# Patient Record
Sex: Female | Born: 2000 | Hispanic: Yes | Marital: Single | Smoking: Current every day smoker
Health system: Southern US, Community
[De-identification: ages and names within clinical notes are randomized; demographics above are authoritative.]

## PROBLEM LIST (undated history)

## (undated) DIAGNOSIS — Z464 Encounter for fitting and adjustment of orthodontic device: Secondary | ICD-10-CM

## (undated) DIAGNOSIS — IMO0001 Reserved for inherently not codable concepts without codable children: Secondary | ICD-10-CM

## (undated) DIAGNOSIS — L0591 Pilonidal cyst without abscess: Secondary | ICD-10-CM

---
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
## 2019-01-12 ENCOUNTER — Emergency Department (HOSPITAL_COMMUNITY): Payer: Medicaid Other

## 2019-01-12 ENCOUNTER — Other Ambulatory Visit: Payer: Self-pay

## 2019-01-12 ENCOUNTER — Emergency Department (HOSPITAL_COMMUNITY)
Admission: EM | Admit: 2019-01-12 | Discharge: 2019-01-12 | Disposition: A | Discharge status: Home / Self Care | Payer: Medicaid Other | Attending: Pediatric Emergency Medicine | Admitting: Pediatric Emergency Medicine

## 2019-01-12 DIAGNOSIS — R569 Unspecified convulsions: Secondary | ICD-10-CM

## 2019-01-12 DIAGNOSIS — E162 Hypoglycemia, unspecified: Secondary | ICD-10-CM | POA: Diagnosis not present

## 2019-01-12 DIAGNOSIS — F17290 Nicotine dependence, other tobacco product, uncomplicated: Secondary | ICD-10-CM | POA: Diagnosis not present

## 2019-01-12 DIAGNOSIS — Z20828 Contact with and (suspected) exposure to other viral communicable diseases: Secondary | ICD-10-CM | POA: Insufficient documentation

## 2019-01-12 LAB — I-STAT BETA HCG BLOOD, ED (MC, WL, AP ONLY): I-stat hCG, quantitative: <5 m[IU]/mL (ref <5)

## 2019-01-12 LAB — BASIC METABOLIC PANEL
Anion gap: 10 (ref 5–15)
BUN: 6 mg/dL (ref 6–20)
CO2: 22 mmol/L (ref 22–32)
Calcium: 9.5 mg/dL (ref 8.9–10.3)
Chloride: 104 mmol/L (ref 98–111)
Creatinine, Ser: 0.61 mg/dL (ref 0.44–1.00)
GFR calc Af Amer: >60 mL/min (ref >60)
GFR calc non Af Amer: >60 mL/min (ref >60)
Glucose, Bld: 63 mg/dL — ABNORMAL LOW (ref 70–99)
Potassium: 4.7 mmol/L (ref 3.5–5.1)
Sodium: 136 mmol/L (ref 135–145)

## 2019-01-12 LAB — CBC
HCT: 38.7 % (ref 36.0–46.0)
Hemoglobin: 11.8 g/dL — ABNORMAL LOW (ref 12.0–15.0)
MCH: 26.5 pg (ref 26.0–34.0)
MCHC: 30.5 g/dL (ref 30.0–36.0)
MCV: 87 fL (ref 80.0–100.0)
Platelets: 167 10*3/uL (ref 150–400)
RBC: 4.45 MIL/uL (ref 3.87–5.11)
RDW: 14.1 % (ref 11.5–15.5)
WBC: 7.8 10*3/uL (ref 4.0–10.5)
nRBC: 0 % (ref 0.0–0.2)

## 2019-01-12 LAB — CBG MONITORING, ED: Glucose-Capillary: 68 mg/dL — ABNORMAL LOW (ref 70–99)

## 2019-01-12 LAB — SARS CORONAVIRUS 2 (TAT 6-24 HRS): SARS Coronavirus 2: NEGATIVE

## 2019-01-12 MED ORDER — ACETAMINOPHEN 325 MG PO TABS
650.0000 mg | ORAL_TABLET | Freq: Once | ORAL | Status: AC
  Administered 2019-01-12: 15:00:00 650 mg via ORAL
  Filled 2019-01-12: qty 2

## 2019-01-12 NOTE — ED Notes (Signed)
Xray here for portable chest

## 2019-01-12 NOTE — ED Triage Notes (Signed)
Pt here for evaluation of seizure like activity yesterday with no hx. Pt sts she was feeling totally normal yesterday when she passed out, fell backwards hitting her head, and her sister witnessed her eyes roll back in her head and have full body shaking x 1 min with urinary incontinence. Pt awoke on her own and tried to get up but then fell backwards again. Pt a/o x4, ambulatory. C/o headache today.

## 2019-01-12 NOTE — ED Notes (Signed)
Pt. Given some graham crackers and peanut butter. Pt. Is resting in her bed.

## 2019-01-12 NOTE — ED Notes (Signed)
Pt. Given some orange juice.  

## 2019-01-12 NOTE — ED Notes (Signed)
Waiting on portable chest and CT

## 2019-01-12 NOTE — ED Notes (Signed)
Pt placed on cardiac monitor 

## 2019-01-12 NOTE — Discharge Instructions (Addendum)
No driving or operating machinery until you are cleared by a neurologist. Make sure you tell family members around you that you have had seizure-like activity in case you have another seizure. Eat regular meals to ensure no low sugar levels.  Stay well hydrated with water.

## 2019-01-12 NOTE — ED Notes (Signed)
Pt ambulatory to the restroom.  

## 2019-01-12 NOTE — ED Provider Notes (Addendum)
[facility] EMERGENCY [facility] Provider Note   CSN: [id number] Arrival date & time: 01/12/19  1245     History   Chief Complaint Chief Complaint  Patient presents with  . Seizures    HPI Nicole Gill is a 18 y.o. female.     Patient presents for assessment since seizure-like activity yesterday evening.  Patient was feeling lightheaded that evening and ended up having an episode of generalized shaking and passing out.  Initially they hope to just from passing out however she had another episode 25 minutes of generalized shaking and she urinated on herself.  No history of similar.  Patient denies any drug use.  No recent head injuries.  Patient has had headaches past 2 weeks that wake her from sleep migraine-like.  Currently mild frontal headache.  No cardiac or neurologic history.  No concerning family history.     Past Medical History:  Diagnosis Date  . Orthodontics    wears braces  . Pilonidal cyst     Patient Active Problem List   Diagnosis Date Noted  . Infected pilonidal cyst 06/25/2014    Past Surgical History:  Procedure Laterality Date  . PILONIDAL CYST EXCISION N/A 06/25/2014   Procedure: EXCISION PILONIDAL CYST PEDIATRIC;  Surgeon: Leonia CoronaShuaib Farooqui, MD;  Location: [facility];  Service: Pediatrics;  Laterality: N/A;     OB History   No obstetric history on file.      Home Medications    Prior to Admission medications   Medication Sig Start Date End Date Taking? Authorizing Provider  ibuprofen (ADVIL) 200 MG tablet Take 200 mg by mouth every 6 (six) hours as needed for mild pain.   Yes [provider]  benzonatate (TESSALON) 100 MG capsule Take 1 capsule (100 mg total) by mouth every 8 (eight) hours. Patient not taking: Reported on 01/12/2019 09/04/18   Fayrene Helperran, [place], PA-C  ondansetron (ZOFRAN) 4 MG tablet Take 1 tablet (4 mg total) by mouth every 8 (eight) hours as needed for nausea or vomiting. Patient not  taking: Reported on 01/12/2019 09/04/18   Fayrene Helperran, [place], PA-C    Family History No family history on file.  Social History Social History   Tobacco Use  . Smoking status: Current Every Day Smoker    Types: E-cigarettes  . Smokeless tobacco: Never Used  Substance Use Topics  . Alcohol use: No  . Drug use: No     Allergies   Patient has no known allergies.   Review of Systems Review of Systems  Constitutional: Negative for chills and fever.  HENT: Negative for congestion.   Eyes: Negative for visual disturbance.  Respiratory: Negative for shortness of breath.   Cardiovascular: Negative for chest pain.  Gastrointestinal: Negative for abdominal pain and vomiting.  Genitourinary: Negative for dysuria and flank pain.  Musculoskeletal: Negative for back pain, neck pain and neck stiffness.  Skin: Negative for rash.  Neurological: Positive for seizures, syncope, light-headedness and headaches.     Physical Exam Updated Vital Signs BP 126/77 (BP Location: Right Arm)   Pulse 98   Temp 97.8 F (36.6 C) (Temporal)   Resp 16   LMP 01/11/2019 (Exact Date)   SpO2 100%   Physical Exam Vitals signs and nursing note reviewed.  Constitutional:      Appearance: She is well-developed.  HENT:     Head: Normocephalic and atraumatic.  Eyes:     General:        Right eye: No discharge.  Left eye: No discharge.     Conjunctiva/sclera: Conjunctivae normal.  Neck:     Musculoskeletal: Normal range of motion and neck supple.     Trachea: No tracheal deviation.  Cardiovascular:     Rate and Rhythm: Normal rate and regular rhythm.  Pulmonary:     Effort: Pulmonary effort is normal.     Breath sounds: Normal breath sounds.  Abdominal:     General: There is no distension.     Palpations: Abdomen is soft.     Tenderness: There is no abdominal tenderness. There is no guarding.  Musculoskeletal: Normal range of motion.        General: No swelling or tenderness.  Skin:     General: Skin is warm.     Findings: No rash.  Neurological:     General: No focal deficit present.     Mental Status: She is alert and oriented to person, place, and time.     Cranial Nerves: No cranial nerve deficit.     Sensory: Sensation is intact.     Motor: Motor function is intact.     Coordination: Coordination is intact.      ED Treatments / Results  Labs (all labs ordered are listed, but only abnormal results are displayed) Labs Reviewed  BASIC METABOLIC PANEL - Abnormal; Notable for the following components:      Result Value   Glucose, Bld 63 (*)    All other components within normal limits  CBC - Abnormal; Notable for the following components:   Hemoglobin 11.8 (*)    All other components within normal limits  CBG MONITORING, ED - Abnormal; Notable for the following components:   Glucose-Capillary 68 (*)    All other components within normal limits  SARS CORONAVIRUS 2 (TAT 6-24 HRS)  I-STAT BETA HCG BLOOD, ED ([facility], [facility], [facility] ONLY)  CBG MONITORING, ED    EKG EKG Interpretation  Date/Time:  Monday January 12 2019 13:55:20 EDT Ventricular Rate:  79 PR Interval:    QRS Duration: 86 QT Interval:  360 QTC Calculation: 413 R Axis:   70 Text Interpretation:  Sinus rhythm Baseline wander in lead(s) V5 Confirmed by Elnora Morrison [phone]) on 01/12/2019 1:57:42 PM   Radiology Dg Chest Portable 1 View  Result Date: 01/12/2019 CLINICAL DATA:  Seizure like activity yesterday. Patient passed out and fell back hitting her head. SOB. Smoker(e-cigarettes). EXAM: PORTABLE CHEST 1 VIEW COMPARISON:  Chest radiograph 09/04/2018 FINDINGS: The heart size and mediastinal contours are within normal limits. The lungs are clear. No pneumothorax or pleural effusion. The visualized skeletal structures are unremarkable. IMPRESSION: No acute cardiopulmonary finding. Electronically Signed   By: Audie Pinto M.D.   On: 01/12/2019 14:21    Procedures Procedures (including critical care  time)  Medications Ordered in ED Medications - No data to display   Initial Impression / Assessment and Plan / ED Course  I have reviewed the triage vital signs and the nursing notes.  Pertinent labs & imaging results that were available during my care of the patient were reviewed by me and considered in my medical decision making (see chart for details).       Patient presents for assessment since 2 seizure-like episodes this evening.  Patient is feeling better today however still mild lightheaded mild headache.  Due to recurrence, headaches waking from sleep blood work ordered and reviewed no significant abnormality.  CT scan of the head pending.  Patient says she has mild difficulty breathing in  the emergency room plan for chest x-ray.  EKG ordered and reviewed sinus no acute abnormality poor baseline. Mild hypoglycemia, orange juice given.  Plan to reassess.  Patient is not on any medications that would decrease her sugars. Discussed patient is not able to drive or operate machinery until cleared by neurology.  Chest x-ray reviewed no acute findings.  Covid test pending. Blood work reviewed minimal anemia 11.8, pregnancy test negative, glucose mild low at 68.    Patient care will be signed out to afternoon clinician to follow-up CT scan head results and reassess.  Darcus Gill was evaluated in Emergency Department on 01/12/2019 for the symptoms described in the history of present illness. She was evaluated in the context of the global COVID-19 pandemic, which necessitated consideration that the patient might be at risk for infection with the SARS-CoV-2 virus that causes COVID-19. Institutional protocols and algorithms that pertain to the evaluation of patients at risk for COVID-19 are in a state of rapid change based on information released by regulatory bodies including the CDC and federal and state organizations. These policies and algorithms were followed during the patient's care in  the ED.   Final Clinical Impressions(s) / ED Diagnoses   Final diagnoses:  Seizure-like activity [facility])  Hypoglycemia    ED Discharge Orders    None       Blane Ohara, MD 01/12/19 1447    Blane Ohara, MD 01/12/19 1447

## 2019-01-12 NOTE — ED Provider Notes (Signed)
18 year old female with seizure-like activity day prior to presentation.  Patient CT head pending at time of signout.  At time of my assessment patient without neurologic deficit overall well-appearing.  No further seizure activity.  CT head returned to normal.  I reviewed.  Patient okay for discharge with plan for close outpatient neurology follow-up.   Brent Bulla, MD 01/12/19 [phone]

## 2019-03-24 ENCOUNTER — Ambulatory Visit: Payer: Medicaid Other | Admitting: Neurology

## 2019-03-24 ENCOUNTER — Encounter

## 2019-04-23 ENCOUNTER — Encounter (HOSPITAL_COMMUNITY): Payer: Self-pay | Admitting: Emergency Medicine

## 2019-04-23 ENCOUNTER — Other Ambulatory Visit: Payer: Self-pay

## 2019-04-23 ENCOUNTER — Emergency Department (HOSPITAL_COMMUNITY): Payer: Worker's Compensation

## 2019-04-23 ENCOUNTER — Emergency Department (HOSPITAL_COMMUNITY)
Admission: EM | Admit: 2019-04-23 | Discharge: 2019-04-24 | Disposition: A | Discharge status: Home / Self Care | Payer: Worker's Compensation | Attending: Emergency Medicine | Admitting: Emergency Medicine

## 2019-04-23 DIAGNOSIS — Z23 Encounter for immunization: Secondary | ICD-10-CM | POA: Insufficient documentation

## 2019-04-23 DIAGNOSIS — S61211A Laceration without foreign body of left index finger without damage to nail, initial encounter: Secondary | ICD-10-CM | POA: Insufficient documentation

## 2019-04-23 DIAGNOSIS — Y929 Unspecified place or not applicable: Secondary | ICD-10-CM | POA: Insufficient documentation

## 2019-04-23 DIAGNOSIS — F17200 Nicotine dependence, unspecified, uncomplicated: Secondary | ICD-10-CM | POA: Diagnosis not present

## 2019-04-23 DIAGNOSIS — W260XXA Contact with knife, initial encounter: Secondary | ICD-10-CM | POA: Insufficient documentation

## 2019-04-23 DIAGNOSIS — Y990 Civilian activity done for income or pay: Secondary | ICD-10-CM | POA: Diagnosis not present

## 2019-04-23 DIAGNOSIS — Y939 Activity, unspecified: Secondary | ICD-10-CM | POA: Insufficient documentation

## 2019-04-23 DIAGNOSIS — S61412A Laceration without foreign body of left hand, initial encounter: Secondary | ICD-10-CM

## 2019-04-23 MED ORDER — LIDOCAINE-EPINEPHRINE (PF) 2 %-1:200000 IJ SOLN
10.0000 mL | Freq: Once | INTRAMUSCULAR | Status: AC
  Administered 2019-04-24: 10 mL
  Filled 2019-04-23: qty 20

## 2019-04-23 NOTE — ED Provider Notes (Signed)
[facility] EMERGENCY DEPARTMENT Provider Note   CSN: [id number] Arrival date & time: 04/23/19  2305     History Chief Complaint  Patient presents with  . Hand Laceration    Nicole Gill is a 19 y.o. female.   Patient RHD  The history is provided by the patient. No language interpreter was used.  Laceration Location:  Hand Hand laceration location:  L palm Length:  5cm Depth:  Through dermis Quality: straight   Bleeding: controlled   Injury mechanism: box cutter. Pain details:    Quality: "it hurts"   Timing:  Constant   Progression:  Unchanged Foreign body present:  No foreign bodies Relieved by: bleeding controlled with pressure. Exacerbated by: pain aggravated by movement. Ineffective treatments:  None tried (No medications PTA) Tetanus status:  Unknown Associated symptoms: no swelling        Past Medical History:  Diagnosis Date  . Orthodontics    wears braces  . Pilonidal cyst     Patient Active Problem List   Diagnosis Date Noted  . Infected pilonidal cyst 06/25/2014    Past Surgical History:  Procedure Laterality Date  . PILONIDAL CYST EXCISION N/A 06/25/2014   Procedure: EXCISION PILONIDAL CYST PEDIATRIC;  Surgeon: Leonia Corona, MD;  Location: [facility];  Service: Pediatrics;  Laterality: N/A;     OB History   No obstetric history on file.     No family history on file.  Social History   Tobacco Use  . Smoking status: Current Every Day Smoker    Types: E-cigarettes  . Smokeless tobacco: Never Used  Substance Use Topics  . Alcohol use: No  . Drug use: No    Home Medications Prior to Admission medications   Medication Sig Start Date End Date Taking? Authorizing Provider  benzonatate (TESSALON) 100 MG capsule Take 1 capsule (100 mg total) by mouth every 8 (eight) hours. Patient not taking: Reported on 01/12/2019 09/04/18   Fayrene Helper, PA-C  ibuprofen (ADVIL) 200 MG tablet Take 200 mg by  mouth every 6 (six) hours as needed for mild pain.    [provider]  ondansetron (ZOFRAN) 4 MG tablet Take 1 tablet (4 mg total) by mouth every 8 (eight) hours as needed for nausea or vomiting. Patient not taking: Reported on 01/12/2019 09/04/18   Fayrene Helper, PA-C    Allergies    Patient has no known allergies.  Review of Systems   Review of Systems  Ten systems reviewed and are negative for acute change, except as noted in the HPI.    Physical Exam Updated Vital Signs BP 126/68 (BP Location: Right Arm)   Pulse 71   Temp 98.1 F (36.7 C) (Oral)   Resp 16   LMP 04/09/2019 (Approximate)   SpO2 99%   Physical Exam Vitals and nursing note reviewed.  Constitutional:      General: She is not in acute distress.    Appearance: She is well-developed. She is not diaphoretic.     Comments: Nontoxic appearing and in NAD  HENT:     Head: Normocephalic and atraumatic.  Eyes:     General: No scleral icterus.    Conjunctiva/sclera: Conjunctivae normal.  Cardiovascular:     Rate and Rhythm: Normal rate and regular rhythm.     Pulses: Normal pulses.     Comments: Distal radial pulse 2+ in the LUE. Capillary refill brisk in all digits of the L hand. Pulmonary:     Effort: Pulmonary  effort is normal. No respiratory distress.     Comments: Respirations even and unlabored. Musculoskeletal:        General: Normal range of motion.     Left hand: Laceration present.       Hands:     Cervical back: Normal range of motion.     Comments: Vertical, well approximated laceration to palm of L hand. No active bleeding. Scant subcutaneous fat. 2cm laceration also noted to the palmar aspect of the L index finger.  Skin:    General: Skin is warm and dry.     Coloration: Skin is not pale.     Findings: No erythema or rash.  Neurological:     Mental Status: She is alert and oriented to person, place, and time.  Psychiatric:        Mood and Affect: Affect is angry.        Behavior:  Behavior is uncooperative and agitated.        Judgment: Judgment is impulsive.     ED Results / Procedures / Treatments   Labs (all labs ordered are listed, but only abnormal results are displayed) Labs Reviewed - No data to display  EKG None  Radiology DG Hand Complete Left  Result Date: 04/23/2019 CLINICAL DATA:  Palm laceration EXAM: LEFT HAND - COMPLETE 3+ VIEW COMPARISON:  None. FINDINGS: Reported laceration of the palmar soft tissues is difficult to visualize on this exam. No visible soft tissue gas or foreign body is seen. A bracelet external to the patient encircles the wrist. No acute osseous abnormality is seen. 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 IMPRESSION: No acute osseous abnormality. Reported laceration of the palmar soft tissues is difficult to visualize on this exam. Electronically Signed   By: Lovena Le M.D.   On: 04/23/2019 23:39    Procedures Procedures (including critical care time)  LACERATION REPAIR Performed by: Antonietta Breach Authorized by: Antonietta Breach Consent: Verbal consent obtained. Risks and benefits: risks, benefits and alternatives were discussed Consent given by: patient Patient identity confirmed: provided demographic data Prepped and Draped in normal sterile fashion Wound explored  Laceration Location: L hand  Laceration Length: 5cm  No Foreign Bodies seen or palpated  Anesthesia: local infiltration  Local anesthetic: lidocaine 1% with epinephrine  Anesthetic total: 5 ml  Irrigation method: syringe Amount of cleaning: standard  Skin closure: 4-0 vicryl  Number of sutures: 4  Technique: horizontal mattress  Patient tolerance: Patient tolerated the procedure well with no immediate complications.  LACERATION REPAIR Performed by: Antonietta Breach Authorized by: Antonietta Breach Consent: Verbal consent obtained. Risks and benefits: risks, benefits and alternatives were discussed Consent given by: patient Patient  identity confirmed: provided demographic data Prepped and Draped in normal sterile fashion Wound explored  Laceration Location: L index finger  Laceration Length: 2cm  No Foreign Bodies seen or palpated  Anesthesia: digital block  Local anesthetic: lidocaine 2% without epinephrine  Anesthetic total: 3 ml  Irrigation method: syringe Amount of cleaning: standard  Skin closure: 5-0 chromic  Number of sutures: 3  Technique: simple interrupted  Patient tolerance: Patient tolerated the procedure well with no immediate complications.    Medications Ordered in ED Medications  lidocaine-EPINEPHrine (XYLOCAINE W/EPI) 2 %-1:200000 (PF) injection 10 mL (10 mLs Other Given 04/24/19 0000)  Tdap (BOOSTRIX) injection 0.5 mL (0.5 mLs Intramuscular Given 04/24/19 0024)  lidocaine (XYLOCAINE) 2 % (with pres) injection 100 mg (100 mg Infiltration Given 04/24/19 0035)    ED Course  I have reviewed the triage vital signs and the nursing notes.  Pertinent labs & imaging results that were available during my care of the patient were reviewed by me and considered in my medical decision making (see chart for details).  Clinical Course as of Apr 23 46  Thu Apr 23, 2019  2339 Patient unsure of last tetanus. Declines tetanus while in the ED. Discussed risks of being out of date on Tdap and the risks associated with tetanus infection. Explained to patient that tetanus infection can be serious and potentially fatal. Advised booster be given while in the ED. Patient, again, declined. She verbalizes understanding of risks and has the capacity to make this decision today.   [KH]  Fri Apr 24, 2019  0011 Patient apologized for her agitated nature during initial encounter. Expresses frustration about the situation that occurred at work. Now amenable to Tdap. This has been ordered.   [KH]    Clinical Course User Index [KH] Antony Madura, PA-C   MDM Rules/Calculators/A&P                      Tdap  booster given. Laceration occurred < 8 hours prior to repair which was well tolerated. Patient has no comorbidities to effect normal wound healing. Discussed suture home care with patient and answered questions. Patient to follow up for wound check as needed. Return precautions discussed and provided. Patient discharged in stable condition with no unaddressed concerns.   Final Clinical Impression(s) / ED Diagnoses Final diagnoses:  Laceration of left palm, initial encounter  Laceration of index finger of left hand without complication, initial encounter    Rx / DC Orders ED Discharge Orders    None       Antony Madura, PA-C 04/24/19 0048    Ward, Layla Maw, DO 04/24/19 [phone]

## 2019-04-23 NOTE — ED Triage Notes (Signed)
Patient presents with skin laceration at left palm sustained this evening from a box cutter at work with moderate bleeding /dressing applied at triage .

## 2019-04-24 MED ORDER — LIDOCAINE HCL 2 % IJ SOLN
5.0000 mL | Freq: Once | INTRAMUSCULAR | Status: AC
  Administered 2019-04-24: 01:00:00 100 mg
  Filled 2019-04-24: qty 20

## 2019-04-24 MED ORDER — TETANUS-DIPHTH-ACELL PERTUSSIS 5-2.5-18.5 LF-MCG/0.5 IM SUSP
0.5000 mL | Freq: Once | INTRAMUSCULAR | Status: AC
  Administered 2019-04-24: 0.5 mL via INTRAMUSCULAR
  Filled 2019-04-24: qty 0.5

## 2019-04-24 NOTE — ED Notes (Signed)
Dermabond given to Beasley PA

## 2019-04-24 NOTE — Discharge Instructions (Addendum)
Avoid soaking your wound in stagnant or dirty water such as while taking a bath. You can shower normally. Keep the area clean with mild soap and warm water. Do not apply peroxide or alcohol to your wound as this can break down newly forming skin and prolong wound healing. If you keep the area bandaged, change the dressing/bandage at least once per day. Take tylenol or ibuprofen as needed for pain. Your stitches will dissolve and do not need to be removed.

## 2019-07-22 ENCOUNTER — Emergency Department (HOSPITAL_COMMUNITY)
Admission: EM | Admit: 2019-07-22 | Discharge: 2019-07-22 | Disposition: A | Discharge status: Home / Self Care | Payer: Medicaid Other | Attending: Emergency Medicine | Admitting: Emergency Medicine

## 2019-07-22 ENCOUNTER — Other Ambulatory Visit: Payer: Self-pay

## 2019-07-22 ENCOUNTER — Encounter (HOSPITAL_COMMUNITY): Payer: Self-pay | Admitting: Emergency Medicine

## 2019-07-22 DIAGNOSIS — F17200 Nicotine dependence, unspecified, uncomplicated: Secondary | ICD-10-CM | POA: Diagnosis not present

## 2019-07-22 DIAGNOSIS — B9689 Other specified bacterial agents as the cause of diseases classified elsewhere: Secondary | ICD-10-CM | POA: Diagnosis not present

## 2019-07-22 DIAGNOSIS — N760 Acute vaginitis: Secondary | ICD-10-CM | POA: Diagnosis not present

## 2019-07-22 DIAGNOSIS — N938 Other specified abnormal uterine and vaginal bleeding: Secondary | ICD-10-CM

## 2019-07-22 DIAGNOSIS — Z3202 Encounter for pregnancy test, result negative: Secondary | ICD-10-CM | POA: Diagnosis not present

## 2019-07-22 LAB — WET PREP, GENITAL
Sperm: NONE SEEN
Trich, Wet Prep: NONE SEEN
Yeast Wet Prep HPF POC: NONE SEEN

## 2019-07-22 LAB — BASIC METABOLIC PANEL
Anion gap: 13 (ref 5–15)
BUN: 10 mg/dL (ref 6–20)
CO2: 24 mmol/L (ref 22–32)
Calcium: 9.6 mg/dL (ref 8.9–10.3)
Chloride: 102 mmol/L (ref 98–111)
Creatinine, Ser: 0.61 mg/dL (ref 0.44–1.00)
GFR calc Af Amer: >60 mL/min (ref >60)
GFR calc non Af Amer: >60 mL/min (ref >60)
Glucose, Bld: 90 mg/dL (ref 70–99)
Potassium: 3.8 mmol/L (ref 3.5–5.1)
Sodium: 139 mmol/L (ref 135–145)

## 2019-07-22 LAB — I-STAT BETA HCG BLOOD, ED (MC, WL, AP ONLY): I-stat hCG, quantitative: <5 m[IU]/mL (ref <5)

## 2019-07-22 LAB — CBC
HCT: 39.5 % (ref 36.0–46.0)
Hemoglobin: 12.4 g/dL (ref 12.0–15.0)
MCH: 27.7 pg (ref 26.0–34.0)
MCHC: 31.4 g/dL (ref 30.0–36.0)
MCV: 88.2 fL (ref 80.0–100.0)
Platelets: 262 10*3/uL (ref 150–400)
RBC: 4.48 MIL/uL (ref 3.87–5.11)
RDW: 14.3 % (ref 11.5–15.5)
WBC: 8.6 10*3/uL (ref 4.0–10.5)
nRBC: 0 % (ref 0.0–0.2)

## 2019-07-22 LAB — URINALYSIS, ROUTINE W REFLEX MICROSCOPIC: RBC / HPF: >50 RBC/hpf — ABNORMAL HIGH (ref 0–5)

## 2019-07-22 MED ORDER — KETOROLAC TROMETHAMINE 30 MG/ML IJ SOLN
30.0000 mg | Freq: Once | INTRAMUSCULAR | Status: AC
  Administered 2019-07-22: 30 mg via INTRAMUSCULAR
  Filled 2019-07-22: qty 1

## 2019-07-22 MED ORDER — METRONIDAZOLE 500 MG PO TABS
500.0000 mg | ORAL_TABLET | Freq: Once | ORAL | Status: AC
  Administered 2019-07-22: 500 mg via ORAL
  Filled 2019-07-22: qty 1

## 2019-07-22 MED ORDER — METRONIDAZOLE 500 MG PO TABS: 500.0000 mg | ORAL_TABLET | Freq: Two times a day (BID) | ORAL | 0 refills | Status: DC

## 2019-07-22 MED ORDER — IBUPROFEN 600 MG PO TABS: 600.0000 mg | ORAL_TABLET | Freq: Four times a day (QID) | ORAL | 0 refills | Status: AC | PRN

## 2019-07-22 NOTE — ED Triage Notes (Signed)
Pt arrives to ED from home with complaints of vaginal bleeding starting last night after unprotected intercourse. Patient states that bleeding occurred immediately after intercourse which was also painful. Patient states that this morning she started to have lower abdominal pain and continued bleeding. Patient had positive and negative pregnancy test at home so unsure if pregnant. Bleeding is controlled and VSS.

## 2019-07-22 NOTE — ED Provider Notes (Signed)
[facility] EMERGENCY DEPARTMENT Provider Note   CSN: [id number] Arrival date & time: 07/22/19  1247     History Chief Complaint  Patient presents with  . Vaginal Bleeding    Nicole Gill is a 19 y.o. female.  Pt presents to the ED today with vaginal bleeding.  The pt said she noticed it yesterday after having sex.  The pt said her period was late and did not last long.  She is worried she's pregnant.  She is having some lower abd pain.  No f/c.  No n/v.        Past Medical History:  Diagnosis Date  . Orthodontics    wears braces  . Pilonidal cyst     Patient Active Problem List   Diagnosis Date Noted  . Infected pilonidal cyst 06/25/2014    Past Surgical History:  Procedure Laterality Date  . PILONIDAL CYST EXCISION N/A 06/25/2014   Procedure: EXCISION PILONIDAL CYST PEDIATRIC;  Surgeon: Leonia Corona, MD;  Location: [facility];  Service: Pediatrics;  Laterality: N/A;     OB History   No obstetric history on file.     History reviewed. No pertinent family history.  Social History   Tobacco Use  . Smoking status: Current Every Day Smoker    Types: E-cigarettes  . Smokeless tobacco: Never Used  Substance Use Topics  . Alcohol use: No  . Drug use: No    Home Medications Prior to Admission medications   Medication Sig Start Date End Date Taking? Authorizing Provider  benzonatate (TESSALON) 100 MG capsule Take 1 capsule (100 mg total) by mouth every 8 (eight) hours. Patient not taking: Reported on 01/12/2019 09/04/18   Fayrene Helper, PA-C  ibuprofen (ADVIL) 600 MG tablet Take 1 tablet (600 mg total) by mouth every 6 (six) hours as needed. 07/22/19   Jacalyn Lefevre, MD  metroNIDAZOLE (FLAGYL) 500 MG tablet Take 1 tablet (500 mg total) by mouth 2 (two) times daily. 07/22/19   Jacalyn Lefevre, MD  ondansetron (ZOFRAN) 4 MG tablet Take 1 tablet (4 mg total) by mouth every 8 (eight) hours as needed for nausea or  vomiting. Patient not taking: Reported on 01/12/2019 09/04/18   Fayrene Helper, PA-C    Allergies    Patient has no known allergies.  Review of Systems   Review of Systems  Gastrointestinal: Positive for abdominal pain.  Genitourinary: Positive for vaginal bleeding.  All other systems reviewed and are negative.   Physical Exam Updated Vital Signs BP 98/63   Pulse 63   Temp 98.2 F (36.8 C) (Oral)   Resp 12   Ht 5\' 4"  (1.626 m)   Wt 59 kg   SpO2 97%   BMI 22.31 kg/m   Physical Exam Vitals and nursing note reviewed. Exam conducted with a chaperone present.  Constitutional:      Appearance: Normal appearance.  HENT:     Head: Normocephalic and atraumatic.     Right Ear: External ear normal.     Left Ear: External ear normal.     Nose: Nose normal.     Mouth/Throat:     Mouth: Mucous membranes are moist.     Pharynx: Oropharynx is clear.  Eyes:     Extraocular Movements: Extraocular movements intact.     Conjunctiva/sclera: Conjunctivae normal.     Pupils: Pupils are equal, round, and reactive to light.  Cardiovascular:     Rate and Rhythm: Normal rate and regular rhythm.  Pulses: Normal pulses.     Heart sounds: Normal heart sounds.  Pulmonary:     Effort: Pulmonary effort is normal.     Breath sounds: Normal breath sounds.  Abdominal:     General: Abdomen is flat. Bowel sounds are normal.     Palpations: Abdomen is soft.  Genitourinary:    Exam position: Lithotomy position.     Cervix: Cervical bleeding present.     Uterus: Normal.      Adnexa:        Right: Tenderness present.        Left: Tenderness present.   Musculoskeletal:     Cervical back: Normal range of motion and neck supple.  Neurological:     Mental Status: She is alert.     ED Results / Procedures / Treatments   Labs (all labs ordered are listed, but only abnormal results are displayed) Labs Reviewed  WET PREP, GENITAL - Abnormal; Notable for the following components:      Result  Value   Clue Cells Wet Prep HPF POC PRESENT (*)    WBC, Wet Prep HPF POC MANY (*)    All other components within normal limits  URINALYSIS, ROUTINE W REFLEX MICROSCOPIC - Abnormal; Notable for the following components:   Color, Urine RED (*)    APPearance TURBID (*)    Glucose, UA   (*)    Value: TEST NOT REPORTED DUE TO COLOR INTERFERENCE OF URINE PIGMENT   Hgb urine dipstick   (*)    Value: TEST NOT REPORTED DUE TO COLOR INTERFERENCE OF URINE PIGMENT   Bilirubin Urine   (*)    Value: TEST NOT REPORTED DUE TO COLOR INTERFERENCE OF URINE PIGMENT   Ketones, ur   (*)    Value: TEST NOT REPORTED DUE TO COLOR INTERFERENCE OF URINE PIGMENT   Protein, ur   (*)    Value: TEST NOT REPORTED DUE TO COLOR INTERFERENCE OF URINE PIGMENT   Nitrite   (*)    Value: TEST NOT REPORTED DUE TO COLOR INTERFERENCE OF URINE PIGMENT   Leukocytes,Ua   (*)    Value: TEST NOT REPORTED DUE TO COLOR INTERFERENCE OF URINE PIGMENT   RBC / HPF >50 (*)    Bacteria, UA RARE (*)    All other components within normal limits  CBC  BASIC METABOLIC PANEL  I-STAT BETA HCG BLOOD, ED ([facility], [facility], [facility] ONLY)  GC/CHLAMYDIA PROBE AMP ([place]) NOT AT [facility]    EKG None  Radiology No results found.  Procedures Procedures (including critical care time)  Medications Ordered in ED Medications  metroNIDAZOLE (FLAGYL) tablet 500 mg (has no administration in time range)  ketorolac (TORADOL) 30 MG/ML injection 30 mg (30 mg Intramuscular Given 07/22/19 2203)    ED Course  I have reviewed the triage vital signs and the nursing notes.  Pertinent labs & imaging results that were available during my care of the patient were reviewed by me and considered in my medical decision making (see chart for details).    MDM Rules/Calculators/A&P                     Pt does have BV, so she will be treated with flagyl.  UA is grossly contaminated with vaginal blood.  Pt likely has DUB.  She is not pregnant.  She wants an [place], but I  don't think she needs one right now as she is not pregnant and hgb is nl.  I  will order one as an outpatient.  She is to f/u with gyn.  Final Clinical Impression(s) / ED Diagnoses Final diagnoses:  Dysfunctional uterine bleeding  BV (bacterial vaginosis)    Rx / DC Orders ED Discharge Orders         Ordered    US PELVIC COMPLETE WITH TRANSVAGINAL     07/22/19 2159    metroNIDAZOLE (FLAGYL) 500 MG tablet  2 times daily     07/22/19 2205    ibuprofen (ADVIL) 600 MG tablet  Every 6 hours PRN     07/22/19 2205           Isla Pence, MD 07/22/19 2216

## 2019-07-24 LAB — GC/CHLAMYDIA PROBE AMP (~~LOC~~) NOT AT ARMC
Chlamydia: NEGATIVE
Comment: NEGATIVE
Comment: NORMAL
Neisseria Gonorrhea: NEGATIVE

## 2019-08-05 ENCOUNTER — Ambulatory Visit (HOSPITAL_COMMUNITY)
Admission: RE | Admit: 2019-08-05 | Discharge: 2019-08-05 | Disposition: A | Discharge status: Home / Self Care | Payer: Medicaid Other | Source: Ambulatory Visit | Attending: Emergency Medicine | Admitting: Emergency Medicine

## 2019-08-05 ENCOUNTER — Other Ambulatory Visit: Payer: Self-pay

## 2019-08-05 DIAGNOSIS — N938 Other specified abnormal uterine and vaginal bleeding: Secondary | ICD-10-CM | POA: Insufficient documentation

## 2019-11-26 ENCOUNTER — Emergency Department (HOSPITAL_COMMUNITY): Payer: Medicaid Other

## 2019-11-26 ENCOUNTER — Other Ambulatory Visit: Payer: Self-pay

## 2019-11-26 ENCOUNTER — Emergency Department (HOSPITAL_COMMUNITY)
Admission: EM | Admit: 2019-11-26 | Discharge: 2019-11-26 | Disposition: A | Discharge status: Home / Self Care | Payer: Medicaid Other | Attending: Emergency Medicine | Admitting: Emergency Medicine

## 2019-11-26 ENCOUNTER — Encounter (HOSPITAL_COMMUNITY): Payer: Self-pay | Admitting: *Deleted

## 2019-11-26 DIAGNOSIS — Z79899 Other long term (current) drug therapy: Secondary | ICD-10-CM | POA: Insufficient documentation

## 2019-11-26 DIAGNOSIS — R319 Hematuria, unspecified: Secondary | ICD-10-CM | POA: Diagnosis not present

## 2019-11-26 DIAGNOSIS — R112 Nausea with vomiting, unspecified: Secondary | ICD-10-CM | POA: Diagnosis not present

## 2019-11-26 DIAGNOSIS — R102 Pelvic and perineal pain: Secondary | ICD-10-CM | POA: Insufficient documentation

## 2019-11-26 DIAGNOSIS — F17290 Nicotine dependence, other tobacco product, uncomplicated: Secondary | ICD-10-CM | POA: Insufficient documentation

## 2019-11-26 DIAGNOSIS — N760 Acute vaginitis: Secondary | ICD-10-CM

## 2019-11-26 DIAGNOSIS — N83209 Unspecified ovarian cyst, unspecified side: Secondary | ICD-10-CM

## 2019-11-26 LAB — I-STAT BETA HCG BLOOD, ED (MC, WL, AP ONLY): I-stat hCG, quantitative: <5 m[IU]/mL (ref <5)

## 2019-11-26 LAB — LIPASE, BLOOD: Lipase: 47 U/L (ref 11–51)

## 2019-11-26 LAB — CBC
HCT: 33.5 % — ABNORMAL LOW (ref 36.0–46.0)
Hemoglobin: 10.6 g/dL — ABNORMAL LOW (ref 12.0–15.0)
MCH: 28 pg (ref 26.0–34.0)
MCHC: 31.6 g/dL (ref 30.0–36.0)
MCV: 88.6 fL (ref 80.0–100.0)
Platelets: 218 10*3/uL (ref 150–400)
RBC: 3.78 MIL/uL — ABNORMAL LOW (ref 3.87–5.11)
RDW: 13.7 % (ref 11.5–15.5)
WBC: 7.2 10*3/uL (ref 4.0–10.5)
nRBC: 0 % (ref 0.0–0.2)

## 2019-11-26 LAB — COMPREHENSIVE METABOLIC PANEL
ALT: 13 U/L (ref 0–44)
AST: 17 U/L (ref 15–41)
Albumin: 4.2 g/dL (ref 3.5–5.0)
Alkaline Phosphatase: 47 U/L (ref 38–126)
Anion gap: 10 (ref 5–15)
BUN: 8 mg/dL (ref 6–20)
CO2: 22 mmol/L (ref 22–32)
Calcium: 9.3 mg/dL (ref 8.9–10.3)
Chloride: 105 mmol/L (ref 98–111)
Creatinine, Ser: 0.53 mg/dL (ref 0.44–1.00)
GFR calc Af Amer: >60 mL/min (ref >60)
GFR calc non Af Amer: >60 mL/min (ref >60)
Glucose, Bld: 81 mg/dL (ref 70–99)
Potassium: 3.5 mmol/L (ref 3.5–5.1)
Sodium: 137 mmol/L (ref 135–145)
Total Bilirubin: 0.5 mg/dL (ref 0.3–1.2)
Total Protein: 6.7 g/dL (ref 6.5–8.1)

## 2019-11-26 LAB — WET PREP, GENITAL
Sperm: NONE SEEN
Trich, Wet Prep: NONE SEEN
Yeast Wet Prep HPF POC: NONE SEEN

## 2019-11-26 MED ORDER — ONDANSETRON 4 MG PO TBDP: 4.0000 mg | ORAL_TABLET | Freq: Three times a day (TID) | ORAL | 0 refills | Status: DC | PRN

## 2019-11-26 MED ORDER — SODIUM CHLORIDE 0.9 % IV BOLUS
1000.0000 mL | Freq: Once | INTRAVENOUS | Status: AC
  Administered 2019-11-26: 1000 mL via INTRAVENOUS

## 2019-11-26 MED ORDER — KETOROLAC TROMETHAMINE 15 MG/ML IJ SOLN
15.0000 mg | Freq: Once | INTRAMUSCULAR | Status: AC
  Administered 2019-11-26: 15 mg via INTRAVENOUS
  Filled 2019-11-26: qty 1

## 2019-11-26 MED ORDER — IOHEXOL 300 MG/ML  SOLN
100.0000 mL | Freq: Once | INTRAMUSCULAR | Status: AC | PRN
  Administered 2019-11-26: 100 mL via INTRAVENOUS

## 2019-11-26 MED ORDER — MORPHINE SULFATE (PF) 4 MG/ML IV SOLN
4.0000 mg | Freq: Once | INTRAVENOUS | Status: AC
  Administered 2019-11-26: 4 mg via INTRAVENOUS
  Filled 2019-11-26: qty 1

## 2019-11-26 MED ORDER — METRONIDAZOLE 500 MG PO TABS: 500.0000 mg | ORAL_TABLET | Freq: Two times a day (BID) | ORAL | 0 refills | Status: AC

## 2019-11-26 MED ORDER — HALOPERIDOL LACTATE 5 MG/ML IJ SOLN
1.0000 mg | Freq: Once | INTRAMUSCULAR | Status: AC
  Administered 2019-11-26: 1 mg via INTRAVENOUS
  Filled 2019-11-26: qty 1

## 2019-11-26 NOTE — ED Provider Notes (Signed)
[facility] EMERGENCY DEPARTMENT Provider Note   CSN: [id number] Arrival date & time: 11/26/19  1014     History Chief Complaint  Patient presents with   Abdominal Pain    Nicole Gill is a 19 y.o. female who presents to ED with a chief complaint of lower abdominal pain.  States that for the past month she has had intermittent cramping lower abdominal pain that is worse with certain movements.  Had a menstrual cycle 1 month ago that lasted about 2 weeks which is unusual for her.  Also reports of ongoing hematuria but denies any dysuria.  Reports vomiting.  She is sexually active, trying to get pregnant but is not concerned about STDs.  Denies any vaginal discharge.  No changes to bowel movements.  No sick contacts with similar symptoms or suspicious food intake.  Denies any prior abdominal surgeries.  No history of ovarian cyst.  Has been taking Tylenol with no significant improvement in pain.  States that this feels different than her usual menstrual cycle cramping. No back pain, fever.  HPI     Past Medical History:  Diagnosis Date   Orthodontics    wears braces   Pilonidal cyst     Patient Active Problem List   Diagnosis Date Noted   Infected pilonidal cyst 06/25/2014    Past Surgical History:  Procedure Laterality Date   PILONIDAL CYST EXCISION N/A 06/25/2014   Procedure: EXCISION PILONIDAL CYST PEDIATRIC;  Surgeon: Leonia Corona, MD;  Location: [facility];  Service: Pediatrics;  Laterality: N/A;     OB History   No obstetric history on file.     History reviewed. No pertinent family history.  Social History   Tobacco Use   Smoking status: Current Every Day Smoker    Types: E-cigarettes   Smokeless tobacco: Never Used  Substance Use Topics   Alcohol use: No   Drug use: No    Home Medications Prior to Admission medications   Medication Sig Start Date End Date Taking? Authorizing Provider  acetaminophen  (TYLENOL) 500 MG tablet Take 1,000 mg by mouth as needed for moderate pain.   Yes [provider]  benzonatate (TESSALON) 100 MG capsule Take 1 capsule (100 mg total) by mouth every 8 (eight) hours. Patient not taking: Reported on 01/12/2019 09/04/18   Fayrene Helper, PA-C  ibuprofen (ADVIL) 600 MG tablet Take 1 tablet (600 mg total) by mouth every 6 (six) hours as needed. Patient not taking: Reported on 11/26/2019 07/22/19   Jacalyn Lefevre, MD  ondansetron (ZOFRAN) 4 MG tablet Take 1 tablet (4 mg total) by mouth every 8 (eight) hours as needed for nausea or vomiting. Patient not taking: Reported on 01/12/2019 09/04/18   Fayrene Helper, PA-C    Allergies    Patient has no known allergies.  Review of Systems   Review of Systems  Constitutional: Negative for appetite change, chills and fever.  HENT: Negative for ear pain, rhinorrhea, sneezing and sore throat.   Eyes: Negative for photophobia and visual disturbance.  Respiratory: Negative for cough, chest tightness, shortness of breath and wheezing.   Cardiovascular: Negative for chest pain and palpitations.  Gastrointestinal: Positive for abdominal pain, nausea and vomiting. Negative for blood in stool, constipation and diarrhea.  Genitourinary: Positive for hematuria. Negative for dysuria and urgency.  Musculoskeletal: Negative for myalgias.  Skin: Negative for rash.  Neurological: Negative for dizziness, weakness and light-headedness.    Physical Exam Updated Vital Signs BP 112/64  Pulse 75    Temp 98.1 F (36.7 C) (Oral)    Resp 16    LMP 10/25/2019    SpO2 100%   Physical Exam Vitals and nursing note reviewed. Exam conducted with a chaperone present.  Constitutional:      General: She is not in acute distress.    Appearance: She is well-developed.  HENT:     Head: Normocephalic and atraumatic.     Nose: Nose normal.  Eyes:     General: No scleral icterus.       Left eye: No discharge.     Conjunctiva/sclera: Conjunctivae  normal.  Cardiovascular:     Rate and Rhythm: Normal rate and regular rhythm.     Heart sounds: Normal heart sounds. No murmur heard.  No friction rub. No gallop.   Pulmonary:     Effort: Pulmonary effort is normal. No respiratory distress.     Breath sounds: Normal breath sounds.  Abdominal:     General: Bowel sounds are normal. There is no distension.     Palpations: Abdomen is soft.     Tenderness: There is no abdominal tenderness. There is no guarding.  Genitourinary:    Vagina: Vaginal discharge present.     Cervix: No cervical motion tenderness.     Uterus: Tender.      Adnexa:        Right: No tenderness.         Left: No tenderness.       Comments: Pelvic exam: normal external genitalia without evidence of trauma. VULVA: normal appearing vulva with no masses, tenderness or lesion. VAGINA: normal appearing vagina with normal color and thick white discharge, no lesions. CERVIX: normal appearing cervix without lesions, cervical motion tenderness absent, cervical os closed with out purulent discharge; No vaginal discharge. Wet prep and DNA probe for chlamydia and GC obtained.   ADNEXA: normal adnexa in size, nontender and no masses UTERUS: uterus is normal size, shape, consistency and some tenderness noted. Musculoskeletal:        General: Normal range of motion.     Cervical back: Normal range of motion and neck supple.  Skin:    General: Skin is warm and dry.     Findings: No rash.  Neurological:     Mental Status: She is alert.     Motor: No abnormal muscle tone.     Coordination: Coordination normal.     ED Results / Procedures / Treatments   Labs (all labs ordered are listed, but only abnormal results are displayed) Labs Reviewed  CBC - Abnormal; Notable for the following components:      Result Value   RBC 3.78 (*)    Hemoglobin 10.6 (*)    HCT 33.5 (*)    All other components within normal limits  WET PREP, GENITAL  LIPASE, BLOOD  COMPREHENSIVE METABOLIC  PANEL  URINALYSIS, ROUTINE W REFLEX MICROSCOPIC  URINALYSIS, ROUTINE W REFLEX MICROSCOPIC  I-STAT BETA HCG BLOOD, ED ([facility], [facility], [facility] ONLY)  GC/CHLAMYDIA PROBE AMP ([place]) NOT AT [facility]    EKG None  Radiology No results found.  Procedures Procedures (including critical care time)  Medications Ordered in ED Medications  morphine 4 MG/ML injection 4 mg (4 mg Intravenous Given 11/26/19 2042)  sodium chloride 0.9 % bolus 1,000 mL (1,000 mLs Intravenous New Bag/Given 11/26/19 2040)    ED Course  I have reviewed the triage vital signs and the nursing notes.  Pertinent labs & imaging results that were  available during my care of the patient were reviewed by me and considered in my medical decision making (see chart for details).    MDM Rules/Calculators/A&P                          19 year old female presenting to the ED with a chief complaint of lower abdominal pain. Symptoms have been going on for 1 month, intermittent and worse with certain movements. Menstrual cycle 1 month ago which lasted 2 weeks which is unusual for her. Reports vomiting, hematuria. Denies any vaginal discharge or concern for STDs. No prior abdominal surgeries. Pelvic exam with moderate amount of vaginal discharge with some midline uterine tenderness without cervical motion tenderness or adnexal tenderness. Abdomen is tender diffusely in the lower abdominal area. Afebrile without recent use of antipyretics. Lab work including CBC, CMP, lipase unremarkable. hCG is negative. GC chlamydia, wet prep and UA pending. Will need to obtain CT to rule out structural cause of her symptoms today. Care handed off to oncoming provider pending remainder of workup.   Portions of this note were generated with Scientist, clinical (histocompatibility and immunogenetics). Dictation errors may occur despite best attempts at proofreading.  Final Clinical Impression(s) / ED Diagnoses Final diagnoses:  Pelvic pain in female    Rx / DC Orders ED Discharge Orders     None       Dietrich Pates, PA-C 11/26/19 2149    Sabas Sous, MD 11/26/19 2333

## 2019-11-26 NOTE — ED Notes (Signed)
Pelvic cart at the bedside 

## 2019-11-26 NOTE — Discharge Instructions (Addendum)
You were evaluated in the Emergency Department and after careful evaluation, we did not find any emergent condition requiring admission or further testing in the hospital.  Your exam/testing today is overall reassuring.  Your symptoms seem to be due to ovarian cyst.  He also to be positive for bacterial vaginosis.  Please take Tylenol 1000 mg every 4-6 hours as well as Motrin 600 mg every 4-6 hours for pain.  You can use the Zofran nausea medication as needed.  Please also take the metronidazole antibiotic as directed.  Please return to the Emergency Department if you experience any worsening of your condition.   Thank you for allowing [place] to be a part of your care.

## 2019-11-26 NOTE — ED Triage Notes (Signed)
Pt reports having lower abd pain and pressure x 1 month. Last menstrual was 8/1 and lasted two weeks. Having small amount of blood in urine. No acute distress noted at triage.

## 2019-11-27 LAB — GC/CHLAMYDIA PROBE AMP (~~LOC~~) NOT AT ARMC
Chlamydia: NEGATIVE
Comment: NEGATIVE
Comment: NORMAL
Neisseria Gonorrhea: NEGATIVE

## 2019-12-04 ENCOUNTER — Encounter (HOSPITAL_COMMUNITY): Payer: Self-pay

## 2019-12-04 ENCOUNTER — Emergency Department (HOSPITAL_COMMUNITY)
Admission: EM | Admit: 2019-12-04 | Discharge: 2019-12-04 | Disposition: A | Discharge status: Home / Self Care | Payer: Medicaid Other | Attending: Emergency Medicine | Admitting: Emergency Medicine

## 2019-12-04 ENCOUNTER — Other Ambulatory Visit: Payer: Self-pay

## 2019-12-04 DIAGNOSIS — N3090 Cystitis, unspecified without hematuria: Secondary | ICD-10-CM | POA: Diagnosis not present

## 2019-12-04 DIAGNOSIS — R5383 Other fatigue: Secondary | ICD-10-CM | POA: Diagnosis not present

## 2019-12-04 DIAGNOSIS — R1031 Right lower quadrant pain: Secondary | ICD-10-CM

## 2019-12-04 DIAGNOSIS — F17290 Nicotine dependence, other tobacco product, uncomplicated: Secondary | ICD-10-CM | POA: Insufficient documentation

## 2019-12-04 DIAGNOSIS — K5900 Constipation, unspecified: Secondary | ICD-10-CM | POA: Diagnosis not present

## 2019-12-04 DIAGNOSIS — K529 Noninfective gastroenteritis and colitis, unspecified: Secondary | ICD-10-CM | POA: Insufficient documentation

## 2019-12-04 DIAGNOSIS — R110 Nausea: Secondary | ICD-10-CM | POA: Diagnosis not present

## 2019-12-04 LAB — COMPREHENSIVE METABOLIC PANEL
ALT: 16 U/L (ref 0–44)
AST: 20 U/L (ref 15–41)
Albumin: 4.4 g/dL (ref 3.5–5.0)
Alkaline Phosphatase: 59 U/L (ref 38–126)
Anion gap: 10 (ref 5–15)
BUN: 6 mg/dL (ref 6–20)
CO2: 23 mmol/L (ref 22–32)
Calcium: 9.2 mg/dL (ref 8.9–10.3)
Chloride: 103 mmol/L (ref 98–111)
Creatinine, Ser: 0.54 mg/dL (ref 0.44–1.00)
GFR calc Af Amer: >60 mL/min (ref >60)
GFR calc non Af Amer: >60 mL/min (ref >60)
Glucose, Bld: 96 mg/dL (ref 70–99)
Potassium: 3.9 mmol/L (ref 3.5–5.1)
Sodium: 136 mmol/L (ref 135–145)
Total Bilirubin: 0.6 mg/dL (ref 0.3–1.2)
Total Protein: 7.1 g/dL (ref 6.5–8.1)

## 2019-12-04 LAB — URINALYSIS, ROUTINE W REFLEX MICROSCOPIC
Bilirubin Urine: NEGATIVE
Glucose, UA: NEGATIVE mg/dL
Hgb urine dipstick: NEGATIVE
Ketones, ur: NEGATIVE mg/dL
Nitrite: NEGATIVE
Protein, ur: NEGATIVE mg/dL
Specific Gravity, Urine: 1.019 (ref 1.005–1.030)
pH: 6 (ref 5.0–8.0)

## 2019-12-04 LAB — CBC
HCT: 36 % (ref 36.0–46.0)
Hemoglobin: 11.5 g/dL — ABNORMAL LOW (ref 12.0–15.0)
MCH: 28.4 pg (ref 26.0–34.0)
MCHC: 31.9 g/dL (ref 30.0–36.0)
MCV: 88.9 fL (ref 80.0–100.0)
Platelets: 244 10*3/uL (ref 150–400)
RBC: 4.05 MIL/uL (ref 3.87–5.11)
RDW: 13.7 % (ref 11.5–15.5)
WBC: 6.5 10*3/uL (ref 4.0–10.5)
nRBC: 0 % (ref 0.0–0.2)

## 2019-12-04 LAB — I-STAT BETA HCG BLOOD, ED (MC, WL, AP ONLY): I-stat hCG, quantitative: <5 m[IU]/mL (ref <5)

## 2019-12-04 LAB — POC OCCULT BLOOD, ED: Fecal Occult Bld: NEGATIVE

## 2019-12-04 LAB — LIPASE, BLOOD: Lipase: 73 U/L — ABNORMAL HIGH (ref 11–51)

## 2019-12-04 MED ORDER — ONDANSETRON HCL 4 MG/2ML IJ SOLN
4.0000 mg | Freq: Once | INTRAMUSCULAR | Status: AC
  Administered 2019-12-04: 4 mg via INTRAVENOUS
  Filled 2019-12-04: qty 2

## 2019-12-04 MED ORDER — CEPHALEXIN 500 MG PO CAPS: 500.0000 mg | ORAL_CAPSULE | Freq: Two times a day (BID) | ORAL | 0 refills | Status: AC

## 2019-12-04 MED ORDER — DICYCLOMINE HCL 20 MG PO TABS: 20.0000 mg | ORAL_TABLET | Freq: Two times a day (BID) | ORAL | 0 refills | Status: AC

## 2019-12-04 MED ORDER — ONDANSETRON 4 MG PO TBDP: 4.0000 mg | ORAL_TABLET | Freq: Three times a day (TID) | ORAL | 0 refills | Status: AC | PRN

## 2019-12-04 MED ORDER — KETOROLAC TROMETHAMINE 15 MG/ML IJ SOLN
15.0000 mg | Freq: Once | INTRAMUSCULAR | Status: AC
  Administered 2019-12-04: 15 mg via INTRAVENOUS
  Filled 2019-12-04: qty 1

## 2019-12-04 MED ORDER — ACETAMINOPHEN 325 MG PO TABS
650.0000 mg | ORAL_TABLET | Freq: Once | ORAL | Status: AC
  Administered 2019-12-04: 650 mg via ORAL
  Filled 2019-12-04: qty 2

## 2019-12-04 NOTE — ED Provider Notes (Signed)
[facility] EMERGENCY DEPARTMENT Provider Note   CSN: [id number] Arrival date & time: 12/04/19  1043     History Chief Complaint  Patient presents with  . Abdominal Pain    Nicole Gill is a 19 y.o. female with past medical history for ovarian cysts, several prior presentations this year for abdominal pain presents to ED for persistent abdominal pain.  Patient states that this episode has been going on for right at 1 week.  Endorses constant lower abdominal pain that is achy, sharp, is associated with a sensation of needing to go to the bathroom but being unable as well as some diarrhea with tenesmus.  Denies fever or chills.  Denies change in urination.  Endorses irregular periods with the current being approximately 10 days late.  On chart review, patient has had several presentations for abdominal pain, recently presenting where she was diagnosed with bacterial vaginosis, thorough pelvic exam showed no concerns for PID and CT scan showed evidence for possible ruptured ovarian cyst as well as some wall thickening of the ascending colon and terminal ileum  The history is provided by the patient.  Abdominal Pain Pain location:  RLQ, LLQ and suprapubic Pain quality: aching and sharp   Pain quality: not cramping   Pain radiates to:  Does not radiate Pain severity:  Severe Onset quality:  Gradual Duration:  1 week Timing:  Constant Context: not recent sexual activity   Ineffective treatments:  Acetaminophen and NSAIDs Associated symptoms: constipation, diarrhea, fatigue and nausea   Associated symptoms: no chest pain, no chills, no cough, no dysuria, no fever, no shortness of breath, no vaginal bleeding, no vaginal discharge and no vomiting        Past Medical History:  Diagnosis Date  . Orthodontics    wears braces  . Pilonidal cyst     Patient Active Problem List   Diagnosis Date Noted  . Infected pilonidal cyst 06/25/2014    Past Surgical  History:  Procedure Laterality Date  . PILONIDAL CYST EXCISION N/A 06/25/2014   Procedure: EXCISION PILONIDAL CYST PEDIATRIC;  Surgeon: Gerald Stabs, MD;  Location: [facility];  Service: Pediatrics;  Laterality: N/A;     OB History   No obstetric history on file.     No family history on file.  Social History   Tobacco Use  . Smoking status: Current Every Day Smoker    Types: E-cigarettes  . Smokeless tobacco: Never Used  Substance Use Topics  . Alcohol use: No  . Drug use: No    Home Medications Prior to Admission medications   Medication Sig Start Date End Date Taking? Authorizing Provider  acetaminophen (TYLENOL) 500 MG tablet Take 1,000 mg by mouth as needed for moderate pain.    [provider]  benzonatate (TESSALON) 100 MG capsule Take 1 capsule (100 mg total) by mouth every 8 (eight) hours. Patient not taking: Reported on 01/12/2019 09/04/18   Domenic Moras, PA-C  cephALEXin (KEFLEX) 500 MG capsule Take 1 capsule (500 mg total) by mouth 2 (two) times daily. 12/04/19   Renold Genta, MD  dicyclomine (BENTYL) 20 MG tablet Take 1 tablet (20 mg total) by mouth 2 (two) times daily. 12/04/19   Renold Genta, MD  ibuprofen (ADVIL) 600 MG tablet Take 1 tablet (600 mg total) by mouth every 6 (six) hours as needed. Patient not taking: Reported on 11/26/2019 07/22/19   Isla Pence, MD  ondansetron (ZOFRAN ODT) 4 MG disintegrating tablet Take 1 tablet (  4 mg total) by mouth every 8 (eight) hours as needed for nausea or vomiting. 12/04/19   Renold Genta, MD  ondansetron (ZOFRAN) 4 MG tablet Take 1 tablet (4 mg total) by mouth every 8 (eight) hours as needed for nausea or vomiting. Patient not taking: Reported on 01/12/2019 09/04/18   Domenic Moras, PA-C    Allergies    Patient has no known allergies.  Review of Systems   Review of Systems  Constitutional: Positive for fatigue and unexpected weight change. Negative for chills and fever.  HENT:  Negative for facial swelling and voice change.   Eyes: Negative for redness and visual disturbance.  Respiratory: Negative for cough and shortness of breath.   Cardiovascular: Negative for chest pain and palpitations.  Gastrointestinal: Positive for abdominal pain, constipation, diarrhea and nausea. Negative for abdominal distention, anal bleeding, blood in stool, rectal pain and vomiting.  Genitourinary: Negative for difficulty urinating, dysuria, vaginal bleeding and vaginal discharge.  Musculoskeletal: Negative for gait problem and joint swelling.  Skin: Negative for rash and wound.  Neurological: Negative for dizziness and headaches.  Psychiatric/Behavioral: Negative for confusion and suicidal ideas.    Physical Exam Updated Vital Signs BP 107/68   Pulse 65   Temp 99 F (37.2 C) (Oral)   Resp 14   SpO2 100%   Physical Exam Constitutional:      General: She is not in acute distress. HENT:     Head: Normocephalic and atraumatic.     Mouth/Throat:     Mouth: Mucous membranes are moist.     Pharynx: Oropharynx is clear.  Eyes:     General: No scleral icterus.    Pupils: Pupils are equal, round, and reactive to light.  Cardiovascular:     Rate and Rhythm: Normal rate and regular rhythm.     Pulses: Normal pulses.  Pulmonary:     Effort: Pulmonary effort is normal. No respiratory distress.  Abdominal:     General: There is no distension.     Tenderness: There is abdominal tenderness in the right lower quadrant, suprapubic area and left lower quadrant.     Comments: Mild tenderness to palpation without rebound across lower quadrants  Musculoskeletal:        General: No tenderness or deformity.     Cervical back: Normal range of motion and neck supple.  Neurological:     General: No focal deficit present.     Mental Status: She is alert and oriented to person, place, and time.  Psychiatric:        Mood and Affect: Mood normal.        Behavior: Behavior normal.     ED  Results / Procedures / Treatments   Labs (all labs ordered are listed, but only abnormal results are displayed) Labs Reviewed  LIPASE, BLOOD - Abnormal; Notable for the following components:      Result Value   Lipase 73 (*)    All other components within normal limits  CBC - Abnormal; Notable for the following components:   Hemoglobin 11.5 (*)    All other components within normal limits  URINALYSIS, ROUTINE W REFLEX MICROSCOPIC - Abnormal; Notable for the following components:   APPearance HAZY (*)    Leukocytes,Ua LARGE (*)    Bacteria, UA MANY (*)    All other components within normal limits  COMPREHENSIVE METABOLIC PANEL  SEDIMENTATION RATE  C-REACTIVE PROTEIN  I-STAT BETA HCG BLOOD, ED ([facility], [facility], [facility] ONLY)  POC OCCULT BLOOD, ED  EKG None  Radiology No results found.  Procedures Procedures (including critical care time)  Medications Ordered in ED Medications  ketorolac (TORADOL) 15 MG/ML injection 15 mg (15 mg Intravenous Given 12/04/19 2158)  acetaminophen (TYLENOL) tablet 650 mg (650 mg Oral Given 12/04/19 2153)  ondansetron (ZOFRAN) injection 4 mg (4 mg Intravenous Given 12/04/19 2243)    ED Course  I have reviewed the triage vital signs and the nursing notes.  Pertinent labs & imaging results that were available during my care of the patient were reviewed by me and considered in my medical decision making (see chart for details).    MDM Rules/Calculators/A&P                           Differential diagnosis considered: Ovarian cyst, ovarian torsion, PID, appendicitis, enteric colitis, IBD, IBS, constipation  Patient presents to ED for lower abdominal pain for which she has been evaluated multiple times before, most recently 1 week ago she was evaluated with a thorough exam and included a pelvic exam and a CT scan.  CT scan at that time showed concerns for ovarian cyst rupture but also showed some nonspecific thickening of the ascending colon and terminal  ileum  Patient has not responded well to NSAIDs over the last week I found it unlikely the patient would have symptoms ovarian cyst rupture lasting this long.  Do not feel the patient needs repeat pelvic exam considering reassuring 1 more week ago and no further discharge or fevers or other hives infectious symptoms.  Doubt ovarian torsion given prolonged duration of symptoms and recurrent nature, benign exam  Labs obtained from triage reviewed and grossly unremarkable, no significant leukocytosis.  Urine could be interpreted as infection with leukoesterase and bacteria and given her suprapubic tenderness, will treat for cystitis.  I am more concerned about undiagnosed IBD at this time given terminal ileal involvement, tenesmus and diarrhea.  Rectal exam performed without fissure, fistula, hematochezia with FOBT negative  We will add on CRP, ESR although do not feel that they required prior to disposition  Given Toradol, Tylenol, Zofran with some improvement in symptoms.  At this time I feel patient is stable for discharge home continue to take Tylenol, ibuprofen will refill Zofran and add on Bentyl  Patient counseled to call gastroenterology for close follow-up as I feel the patient would likely benefit from colonoscopy and GI evaluation  Labs reviewed and interpreted by myself with significant findings above. Imaging reviewed by myself and interpreted by radiologist.  Case and plan above discussed with my attending Dr. Gilford Raid  Final Clinical Impression(s) / ED Diagnoses Final diagnoses:  Cystitis  Right lower quadrant abdominal pain  Colitis    Rx / DC Orders ED Discharge Orders         Ordered    ondansetron (ZOFRAN ODT) 4 MG disintegrating tablet  Every 8 hours PRN        12/04/19 2223    dicyclomine (BENTYL) 20 MG tablet  2 times daily        12/04/19 2223    cephALEXin (KEFLEX) 500 MG capsule  2 times daily        12/04/19 2226         Labs, studies and imaging  reviewed by myself and considered in medical decision making if ordered. Imaging interpreted by radiology. Pt was discussed with my attending, Dr. Gilford Raid  Electronically signed by:  Roderic Palau [place]9/10/202111:38 PM  Renold Genta, MD 12/04/19 7011    Isla Pence, MD 12/05/19 [phone]

## 2019-12-04 NOTE — ED Notes (Signed)
Discharge instructions discussed with pt. Pt verbalized understanding. Pt stable and ambulatory. No singature pad avilable

## 2019-12-04 NOTE — ED Triage Notes (Signed)
Pt arrives to ED w/ c/o 10/10 abdominal pain pt endorses n/v. Pt was seen here on 9/2 and diagnosed with ovarian cyst.

## 2019-12-05 LAB — SEDIMENTATION RATE: Sed Rate: 10 mm/hr (ref 0–22)

## 2019-12-05 LAB — C-REACTIVE PROTEIN: CRP: 0.6 mg/dL (ref <1.0)

## 2019-12-14 ENCOUNTER — Encounter: Payer: Self-pay | Admitting: Physician Assistant

## 2019-12-17 ENCOUNTER — Other Ambulatory Visit: Payer: Self-pay

## 2019-12-17 ENCOUNTER — Encounter: Payer: Self-pay | Admitting: Obstetrics & Gynecology

## 2019-12-17 ENCOUNTER — Other Ambulatory Visit (HOSPITAL_COMMUNITY)
Admission: RE | Admit: 2019-12-17 | Discharge: 2019-12-17 | Disposition: A | Discharge status: Home / Self Care | Payer: Medicaid Other | Source: Ambulatory Visit | Attending: Obstetrics & Gynecology | Admitting: Obstetrics & Gynecology

## 2019-12-17 ENCOUNTER — Ambulatory Visit (INDEPENDENT_AMBULATORY_CARE_PROVIDER_SITE_OTHER): Payer: Medicaid Other | Admitting: Clinical

## 2019-12-17 ENCOUNTER — Ambulatory Visit: Payer: Self-pay | Admitting: Physician Assistant

## 2019-12-17 ENCOUNTER — Ambulatory Visit (INDEPENDENT_AMBULATORY_CARE_PROVIDER_SITE_OTHER): Payer: Medicaid Other | Admitting: Obstetrics & Gynecology

## 2019-12-17 VITALS — BP 113/76 | HR 71 | Ht 64.0 in | Wt 121.3 lb

## 2019-12-17 DIAGNOSIS — R102 Pelvic and perineal pain: Secondary | ICD-10-CM | POA: Insufficient documentation

## 2019-12-17 DIAGNOSIS — F4321 Adjustment disorder with depressed mood: Secondary | ICD-10-CM

## 2019-12-17 DIAGNOSIS — N926 Irregular menstruation, unspecified: Secondary | ICD-10-CM

## 2019-12-17 DIAGNOSIS — F319 Bipolar disorder, unspecified: Secondary | ICD-10-CM

## 2019-12-17 DIAGNOSIS — F3189 Other bipolar disorder: Secondary | ICD-10-CM | POA: Diagnosis not present

## 2019-12-17 MED ORDER — DOXYCYCLINE HYCLATE 100 MG PO CAPS: 100.0000 mg | ORAL_CAPSULE | Freq: Two times a day (BID) | ORAL | 0 refills | Status: DC

## 2019-12-17 NOTE — Patient Instructions (Signed)
Pelvic Pain, Female Pelvic pain is pain in your lower belly (abdomen), below your belly button and between your hips. The pain may start suddenly (be acute), keep coming back (be recurring), or last a long time (become chronic). Pelvic pain that lasts longer than 6 months is called chronic pelvic pain. There are many causes of pelvic pain. Sometimes the cause of pelvic pain is not known. Follow these instructions at home:   Take over-the-counter and prescription medicines only as told by your doctor.  Rest as told by your doctor.  Do not have sex if it hurts.  Keep a journal of your pelvic pain. Write down: ? When the pain started. ? Where the pain is located. ? What seems to make the pain better or worse, such as food or your period (menstrual cycle). ? Any symptoms you have along with the pain.  Keep all follow-up visits as told by your doctor. This is important. Contact a doctor if:  Medicine does not help your pain.  Your pain comes back.  You have new symptoms.  You have unusual discharge or bleeding from your vagina.  You have a fever or chills.  You are having trouble pooping (constipation).  You have blood in your pee (urine) or poop (stool).  Your pee smells bad.  You feel weak or light-headed. Get help right away if:  You have sudden pain that is very bad.  Your pain keeps getting worse.  You have very bad pain and also have any of these symptoms: ? A fever. ? Feeling sick to your stomach (nausea). ? Throwing up (vomiting). ? Being very sweaty.  You pass out (lose consciousness). Summary  Pelvic pain is pain in your lower belly (abdomen), below your belly button and between your hips.  There are many possible causes of pelvic pain.  Keep a journal of your pelvic pain. This information is not intended to replace advice given to you by your health care provider. Make sure you discuss any questions you have with your health care provider. Document  Revised: 08/28/2017 Document Reviewed: 08/28/2017 Elsevier Patient Education  2020 Elsevier Inc.  

## 2019-12-17 NOTE — Patient Instructions (Addendum)
[facility] [address] [place], [place] [address] [phone] (main office) [phone] (Rashun Grattan's office)  /Emotional Wellbeing Apps and Websites Here are a few free apps meant to help you to help yourself.  To find, try searching on the internet to see if the app is offered on Apple/Android devices. If your first choice doesn't come up on your device, the good news is that there are many choices! Play around with different apps to see which ones are helpful to you.    Calm This is an app meant to help increase calm feelings. Includes info, strategies, and tools for tracking your feelings.      Calm Harm  This app is meant to help with self-harm. Provides many 5-minute or 15-min coping strategies for doing instead of hurting yourself.       Healthy Minds Health Minds is a problem-solving tool to help deal with emotions and cope with stress you encounter wherever you are.      MindShift This app can help people cope with anxiety. Rather than trying to avoid anxiety, you can make an important shift and face it.      MY3  MY3 features a support system, safety plan and resources with the goal of offering a tool to use in a time of need.       My Life My Voice  This mood journal offers a simple solution for tracking your thoughts, feelings and moods. Animated emoticons can help identify your mood.       Relax Melodies Designed to help with sleep, on this app you can mix sounds and meditations for relaxation.      Smiling Mind Smiling Mind is meditation made easy: it's a simple tool that helps put a smile on your mind.        Stop, Breathe & Think  A friendly, simple guide for people through meditations for mindfulness and compassion.  Stop, Breathe and Think Kids Enter your current feelings and choose a "mission" to help you cope. Offers videos for certain moods instead of just sound recordings.       Team  Orange The goal of this tool is to help teens change how they think, act, and react. This app helps you focus on your own good feelings and experiences.      The Virtual Hope Box The Virtual Hope Box (VHB) contains simple tools to help patients with coping, relaxation, distraction, and positive thinking.     Behavioral Health Resources:   What if I or someone I know is in crisis?  . If you are thinking about harming yourself or having thoughts of suicide, or if you know someone who is, seek help right away.  . Call your doctor or mental health care provider.  . Call 911 or go to a hospital emergency room to get immediate help, or ask a friend or family member to help you do these things; IF YOU ARE IN [place], YOU MAY GO TO WALK-IN URGENT CARE 24/7 at [facility] (see below)  . Call the [place] National Suicide Prevention Lifeline's toll-free, 24-hour hotline at 1-800-273-TALK (1-800-273-8255) or TTY: 1-800-799-4 TTY (1-800-799-4889) to talk to a trained counselor.  . If you are in crisis, make sure you are not left alone.   . If someone else is in crisis, make sure he or she is not left alone   24 Hour :   [facility]  [address], [place], [place] [address] [phone] or [phone] WALK-IN URGENT CARE 24/7  [facility] Mobile Crisis: 1-877-626-1772  [place] National Suicide Hotline: 1-800-273-8255  [facility] Crisis Line (Domestic Violence, Rape & Victim Assistance)  336-273-7273  [facility]  [address] [place], [place]  [address]   1-855-788-8787 or [phone]   [facility]: [phone] (8am-4pm) or 1-866- 261-5769 (after hours)        [facility] 24/7 Walk-in Clinic, [address], [place], [place]  [phone] Fax: [phone] [email or web address] *Interpreters available *Accepts all  insurance and uninsured for Urgent Care needs *Accepts Medicaid and uninsured for outpatient treatment   [facility]   Mon-Fri: 8am-5pm [address], [place], [place] [phone](phone); [phone](fax) [email or web address]  *Accepts Medicare  [facility] Mon, Tues, Thurs, Fri: 8am-4pm [address], [place], [place] [address] [phone] (phone); [phone] (fax) [email or web address]  *Accepts Medicare  [facility] Mon-Fri: 9am-5pm  [address], [place], [place] [phone] (phone); [phone]  [email or web address]  *Accepts Medicaid  [facility] [address], [place], [place]  [phone] [email or web address]   [facility] Mon-Fri, 8:30am-12pm/1pm-2:30pm [address], [place], [place] [phone] (phone); [phone] (fax) www.[email or web address]  *Accepts Medicaid, sliding-scale*Bilingual services available  [facility] Mon-Fri, 8am-7pm [address], [place], [place]  [phone](phone); [phone](fax) www.[email or web address]  *Accepts Medicaid *Bilingual services available  [facility] Mon-Fri: 8am-5pm, Saturday by appointment only [address], [place], [place] [phone] (phone); [phone] (fax) [email or web address]   [facility] [address], [place], [place] [phone] [email or web address]  *Free & reduced services for uninsured and underinsured individuals *Bilingual services for Spanish-speaking clients 21 and under  [facility] 24/7 Walk-in Clinic, [address], [place], [place] [phone](phone); [phone](fax) www.[email or web address]  *Bring your own interpreter at first visit *Accepts Medicare and Medicaid  [facility] Mon-Fri: 9am-5:30pm [address], [place], [place] [phone] (phone), [phone] (fax) After hours crisis line: [phone] [email or web address]  *Accepts Medicare and Medicaid  [facility] Mon-Thurs, 8am-6pm [address], [place], [place]  [phone] (phone); [phone] (fax) [email or web address]://[email or web address]  *Subsidized costs available  [facility] Mon-Fri: 8am-4pm [address], [place], [place] [phone](phone); [phone](fax) www.[email or web address]  *Accepts Medicaid  [facility] Same day access hours: Mon-Fri, 8:30-3pm Crisis hours: Mon-Fri, 8am-5pm [address], [place], [place] [phone]  [facility] Same day access hours: Mon-Fri, 8:30-3pm Crisis hours: Mon-Fri, 8am-8pm [address], [place], [place] [phone] (phone); [phone] (fax) www.[email or web address]  *Accepts Medicaid and Medicare  [facility] Mon, Wed, Fri: 9am-9pm Tues, Thurs: 9am-6pm [address], [place], [place]  [phone] (phone); [phone] (fax) [email or web address]  *(Accepts Medicare and Medicaid; payment plans available)*Bilingual services available  [facility] [address], [place], [place] [phone] (phone); [phone] (fax) [email or web address]   [facility] [address], [place], [place]  [phone]  [email or web address]  *Bilingual services available  [facility] Mon-Thurs: 8am-8pm [address], [place], [place] [phone] (phone); [phone] (fax) [email or web address]  *Accepts Medicaid*Bilingual services available  [facility] [address] [place],  [phone] (phone) [email or web address]  *Accepts Medicaid *Bilingual services  available  [facility] Mon-Fri, 9am-4:45pm [address], [place],  [phone] (phone); [phone] (fax) [email or web address]://[email or web address]  *Accepts Medicare  [facility] Mon-Thurs: 8:30-8pm, Fri: 8:30am-7pm [address], [place],  (3rd floor) [phone] (phone);   [phone] (fax) [email or web address]  *Accepts Medicaid; income-based reduced rates available  [facility] Mon-Fri: 8am-5pm [address], [place], [place] [address] [phone] (phone); [phone] (fax) [email or web address]://[email or web address]  *Accepts Medicaid*Bilingual services available   [facility] [place])  [address], [place] [phone] www.[email or web address]  *Provides direct services to individuals in recovery from mental illness, including support groups, recovery skills classes, and one on one peer support  NAMI (National Alliance on Mental Illness) [place] NAMI helpline: 336-370-4264  NAMI [place] helpline: 1-800-451-9682 https://namiguilford.org  *A community hub for information relating to local resources and services for the friends and families of individuals living alongside a mental health condition, as well as the individuals themselves. Classes and support groups also provided     

## 2019-12-17 NOTE — BH Specialist Note (Signed)
  Integrated Behavioral Health Initial Visit  MRN: [id number] Name: Nicole Gill  Number of Integrated Behavioral Health Clinician visits:: 1/6 Session Start time: 2:15  Session End time: 2:44 Total time: 29  Type of Service: Integrated Behavioral Health- Individual/Family Interpretor:No. Interpretor Name and Language: n/a   Warm Hand Off Completed.       SUBJECTIVE: Nicole Gill is a 19 y.o. female accompanied by n/a Patient was referred by Scheryl Darter, MD for positive depression screen. Patient reports the following symptoms/concerns: Pt states her primary concern today is grieving the loss of 8 friends from high school during pandemic, along with increase in depression, anxiety and untreated bipolar disorder since pandemic began. Pt denies current SI or HI; last felt SI about one month ago, and agrees to go to Urgent Care [facility] ([address] walk-in) if SI returns.  Duration of problem: Ongoing, with increase in symptoms after numerous losses; Severity of problem: severe  OBJECTIVE: Mood: Anxious and Depressed and Affect: Depressed Risk of harm to self or others: No plan to harm self or others; SI about one month ago  LIFE CONTEXT: Family and Social: Pt lives by herself; has mother and siblings nearby School/Work: - Self-Care: - Life Changes: Loss of 8 friends from high school in past two years  GOALS ADDRESSED: Patient will: 1. Reduce symptoms of: anxiety, depression, mood instability and stress 2. Increase knowledge and/or ability of: stress reduction  3. Demonstrate ability to: Increase healthy adjustment to current life circumstances, Increase motivation to adhere to plan of care and Continue healthy grieving over losses  INTERVENTIONS: Interventions utilized: Supportive Counseling, Psychoeducation and/or Health Education and Safety  Standardized Assessments completed: C-SSRS Short, GAD-7 and PHQ 9  ASSESSMENT: Patient currently experiencing Grief and History  of bipolar affective disorder.   Patient may benefit from psychoeducation and brief therapeutic interventions regarding coping with symptoms of depression and anxiety .  PLAN: 1. Follow up with behavioral health clinician on : Two weeks 2. Behavioral recommendations:  -Call Asher Muir at [phone] to make follow up appoinment (after next week work schedule is posted) -Follow Safety Plan, as discussed -Accept referral to psychiatry -Consider grief counseling, as needed (information sent via MyChart) -Consider apps, as discussed, for additional self-care 3. Referral(s): Integrated [facility] (In Clinic) and [facility] (LME/Outside Clinic)  Valetta Close Hudson, LCSW         Valetta Close [facility]  Depression screen [facility] 2/9 12/17/2019  Decreased Interest 2  Down, Depressed, Hopeless 3  PHQ - 2 Score 5  Altered sleeping 3  Tired, decreased energy 2  Change in appetite 3  Feeling bad or failure about yourself  2  Trouble concentrating 0  Moving slowly or fidgety/restless 1  Suicidal thoughts 2  PHQ-9 Score 18   GAD 7 : Generalized Anxiety Score 12/17/2019  Nervous, Anxious, on Edge 3  Control/stop worrying 2  Worry too much - different things 2  Trouble relaxing 0  Restless 0  Easily annoyed or irritable 2  Afraid - awful might happen 2  Total GAD 7 Score 11

## 2019-12-17 NOTE — Progress Notes (Signed)
Patient ID: Nicole Gill, female   DOB: [birth year], 19 y.o.   MRN: [id number]  Chief Complaint  Patient presents with  . Gynecologic Exam    HPI Nicole Gill is a 19 y.o. female.  No obstetric history on file. Patient's last menstrual period was 12/05/2019. She reports pelvic pain and irregular menses for the last 5 months. She was seen in the ED and imaging was done and result was reviewed. UTI was treated with Keflex 11/26/19. Patient states she would like to conceive and hasn't used BCM HPI  Past Medical History:  Diagnosis Date  . Orthodontics    wears braces  . Pilonidal cyst     Past Surgical History:  Procedure Laterality Date  . PILONIDAL CYST EXCISION N/A 06/25/2014   Procedure: EXCISION PILONIDAL CYST PEDIATRIC;  Surgeon: Leonia Corona, MD;  Location:  SUR[facility]ERY CENTER;  Service: Pediatrics;  Laterality: N/A;    No family history on file.  Social History Social History   Tobacco Use  . Smoking status: Current Every Day Smoker    Types: E-cigarettes  . Smokeless tobacco: Never Used  Substance Use Topics  . Alcohol use: No  . Drug use: No    No Known Allergies  Current Outpatient Medications  Medication Sig Dispense Refill  . acetaminophen (TYLENOL) 500 MG tablet Take 1,000 mg by mouth as needed for moderate pain.    . cephALEXin (KEFLEX) 500 MG capsule Take 1 capsule (500 mg total) by mouth 2 (two) times daily. 14 capsule 0  . dicyclomine (BENTYL) 20 MG tablet Take 1 tablet (20 mg total) by mouth 2 (two) times daily. 20 tablet 0  . ibuprofen (ADVIL) 600 MG tablet Take 1 tablet (600 mg total) by mouth every 6 (six) hours as needed. 30 tablet 0  . benzonatate (TESSALON) 100 MG capsule Take 1 capsule (100 mg total) by mouth every 8 (eight) hours. (Patient not taking: Reported on 01/12/2019) 21 capsule 0  . doxycycline (VIBRAMYCIN) 100 MG capsule Take 1 capsule (100 mg total) by mouth 2 (two) times daily. 14 capsule 0  . ondansetron (ZOFRAN ODT) 4  MG disintegrating tablet Take 1 tablet (4 mg total) by mouth every 8 (eight) hours as needed for nausea or vomiting. 20 tablet 0  . ondansetron (ZOFRAN) 4 MG tablet Take 1 tablet (4 mg total) by mouth every 8 (eight) hours as needed for nausea or vomiting. (Patient not taking: Reported on 01/12/2019) 12 tablet 0   No current facility-administered medications for this visit.    Review of Systems Review of Systems  Constitutional: Positive for unexpected weight change.  Gastrointestinal: Positive for nausea.  Genitourinary: Positive for dyspareunia, menstrual problem, pelvic pain, vaginal bleeding and vaginal discharge.    Blood pressure 113/76, pulse 71, height 5\' 4"  (1.626 m), weight 121 lb 4.8 oz (55 kg), last menstrual period 12/05/2019.  Physical Exam Physical Exam Vitals and nursing note reviewed. Exam conducted with a chaperone present.  Constitutional:      Appearance: Normal appearance.  Pulmonary:     Effort: Pulmonary effort is normal.  Abdominal:     General: Abdomen is flat.     Palpations: Abdomen is soft.  Genitourinary:    Comments: Pelvic exam: VULVA: normal appearing vulva with no masses, tenderness or lesions, VAGINA: vaginal discharge - white, CERVIX: cervical discharge present - white, UTERUS: tenderness mild CMT, ADNEXA: normal adnexa in size, nontender and no masses.  Neurological:     Mental Status: She is alert.  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

## 2019-12-17 NOTE — Progress Notes (Signed)
Pt concerned of weight loss due to nausea.

## 2019-12-18 ENCOUNTER — Other Ambulatory Visit: Payer: Self-pay | Admitting: Obstetrics & Gynecology

## 2019-12-18 ENCOUNTER — Encounter: Payer: Self-pay | Admitting: *Deleted

## 2019-12-18 DIAGNOSIS — R102 Pelvic and perineal pain: Secondary | ICD-10-CM

## 2019-12-18 LAB — CERVICOVAGINAL ANCILLARY ONLY
Bacterial Vaginitis (gardnerella): POSITIVE — AB
Candida Glabrata: NEGATIVE
Candida Vaginitis: POSITIVE — AB
Chlamydia: NEGATIVE
Comment: NEGATIVE
Comment: NEGATIVE
Comment: NEGATIVE
Comment: NEGATIVE
Comment: NEGATIVE
Comment: NORMAL
Neisseria Gonorrhea: NEGATIVE
Trichomonas: NEGATIVE

## 2019-12-18 MED ORDER — METRONIDAZOLE 500 MG PO TABS: 500.0000 mg | ORAL_TABLET | Freq: Two times a day (BID) | ORAL | 0 refills | Status: AC

## 2019-12-18 MED ORDER — FLUCONAZOLE 150 MG PO TABS: 150.0000 mg | ORAL_TABLET | Freq: Once | ORAL | 0 refills | Status: AC

## 2020-01-14 ENCOUNTER — Ambulatory Visit: Payer: Medicaid Other | Admitting: Physician Assistant

## 2020-05-18 ENCOUNTER — Other Ambulatory Visit: Payer: Self-pay

## 2020-05-18 DIAGNOSIS — R102 Pelvic and perineal pain: Secondary | ICD-10-CM

## 2020-05-18 DIAGNOSIS — N926 Irregular menstruation, unspecified: Secondary | ICD-10-CM

## 2020-05-18 MED ORDER — DOXYCYCLINE HYCLATE 100 MG PO CAPS: 100.0000 mg | ORAL_CAPSULE | Freq: Two times a day (BID) | ORAL | 0 refills | Status: AC

## 2020-05-30 ENCOUNTER — Other Ambulatory Visit: Payer: Self-pay | Admitting: Clinical

## 2020-06-06 NOTE — BH Specialist Note (Signed)
Pt did not arrive to video visit and did not answer the phone, as "call cannot be completed at this time" after three calls.  ; left MyChart message for patient.

## 2020-06-16 ENCOUNTER — Telehealth: Payer: Self-pay | Admitting: Clinical

## 2020-06-16 NOTE — Telephone Encounter (Signed)
Pt returned call from [facility] after leaving MyChart message: Pt says she is not currently at her address, but in [place], where she went to a behavioral health location(uncertain name) and was turned away because she did not have a therapist referral.   Pt is asked if she'd like to move her appointment with [facility] Asher Muir ([facility]) to this afternoon at 3:45pm, and she said she would not be able to do a visit at that time. Pt is informed she can utilize [facility] 24/7, and she says that she would like to do that. Pt says she can keep herself safe tonight, and would be able to go tomorrow morning to [facility]. Pt has the address and phone number to call, and pt says if SI returns tonight, she will go tonight instead.   Pt agrees to 1: Go to [facility] by no later than tomorrow 06/17/20 (earlier if needed) and 2. Follow up with [facility] Chiquitta Matty for virtual visit on 06/20/20 at 9:15am.

## 2020-06-20 ENCOUNTER — Ambulatory Visit: Payer: Self-pay | Admitting: Clinical

## 2020-06-20 DIAGNOSIS — Z91199 Patient's noncompliance with other medical treatment and regimen due to unspecified reason: Secondary | ICD-10-CM

## 2020-06-20 DIAGNOSIS — Z5329 Procedure and treatment not carried out because of patient's decision for other reasons: Secondary | ICD-10-CM

## 2021-11-04 IMAGING — CT CT ABD-PELV W/ CM
2 of 4 series · 14 of 46 positions shown, 16 images · IV contrast (Omni 300)
Comparison: Pelvic ultrasound 08/05/2019

CLINICAL DATA: Right lower quadrant abdominal pain

EXAM:
CT ABDOMEN AND PELVIS WITH CONTRAST
TECHNIQUE: Multidetector CT imaging of the abdomen and pelvis was performed
using the standard protocol following bolus administration of
intravenous contrast.
CONTRAST:  100mL OMNIPAQUE IOHEXOL 300 MG/ML  SOLN

[Series 5: a/p w/ 5mm · axial · 0.58mm/px · z∈[+600,+980]mm · 11 of 92 slices shown, 13 images]
[im 8/92  soft-tissue]
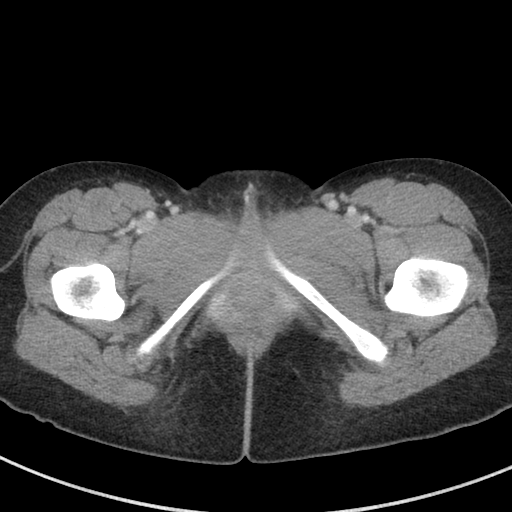
[im 8/92  bone]
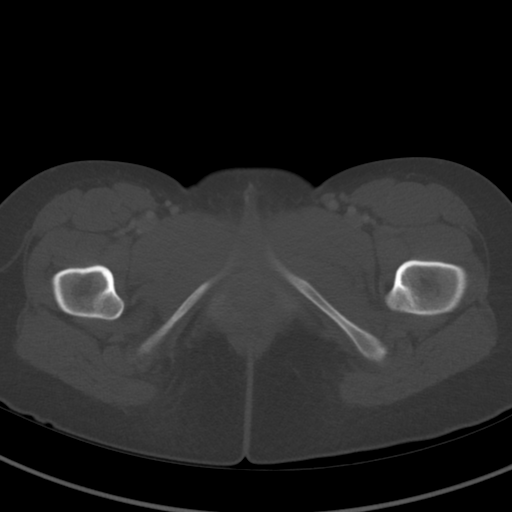
[im 15/92  soft-tissue]
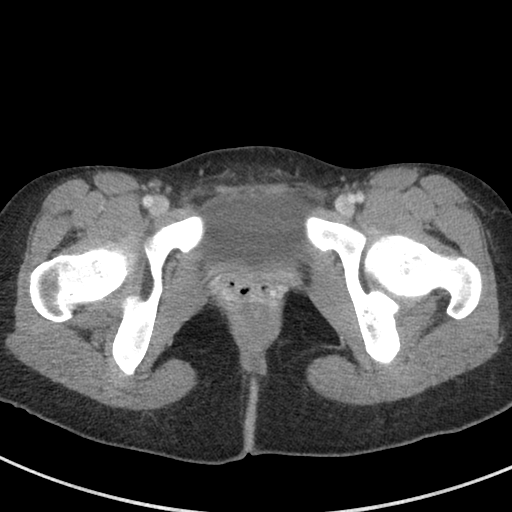
[im 22/92  soft-tissue]
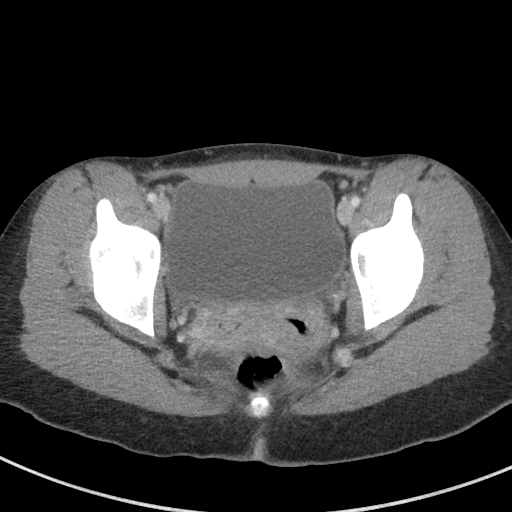
[im 30/92  soft-tissue]
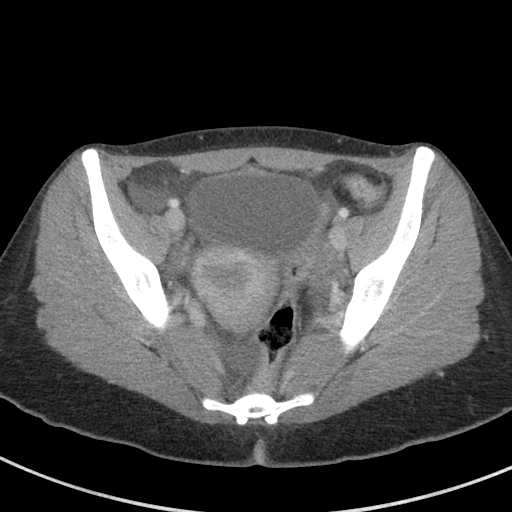
[im 37/92  soft-tissue]
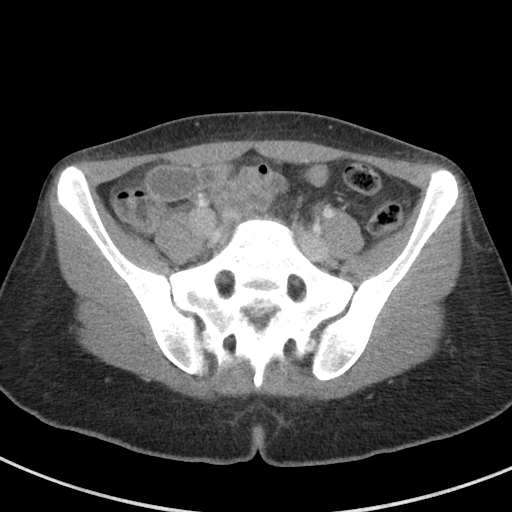
[im 48/92  soft-tissue]
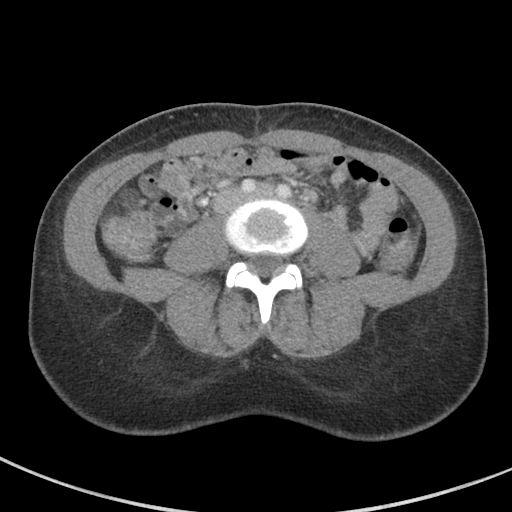
[im 55/92  soft-tissue]
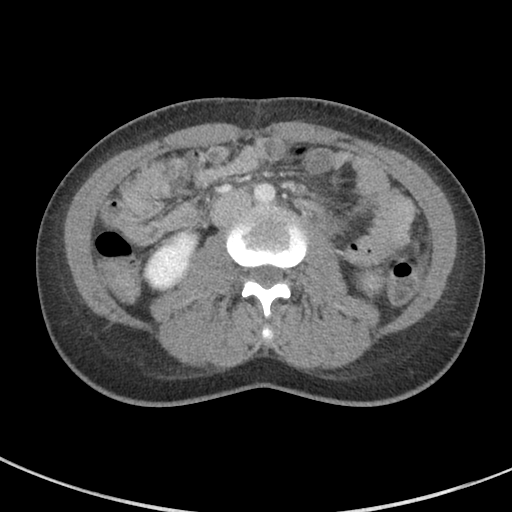
[im 62/92  soft-tissue]
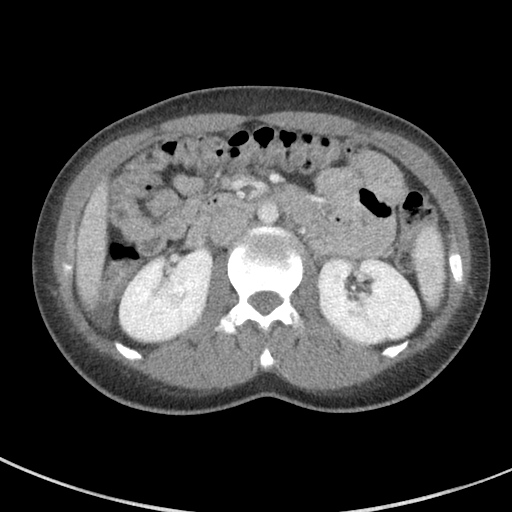
[im 70/92  soft-tissue]
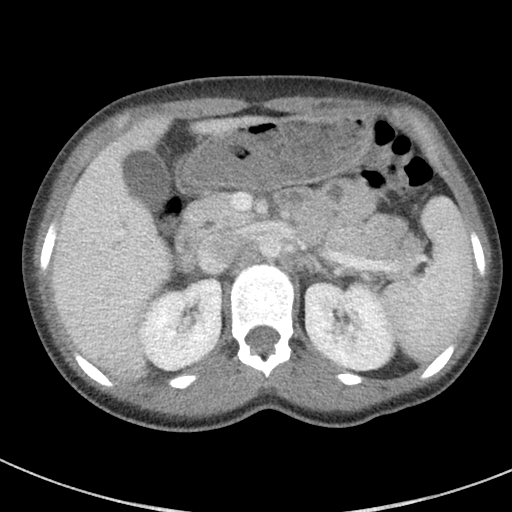
[im 70/92  bone]
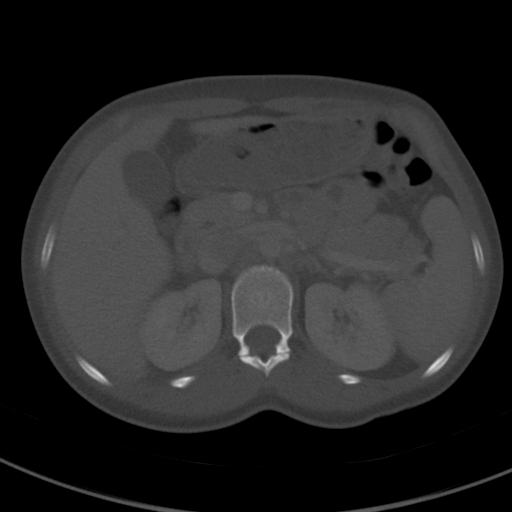
[im 77/92  soft-tissue]
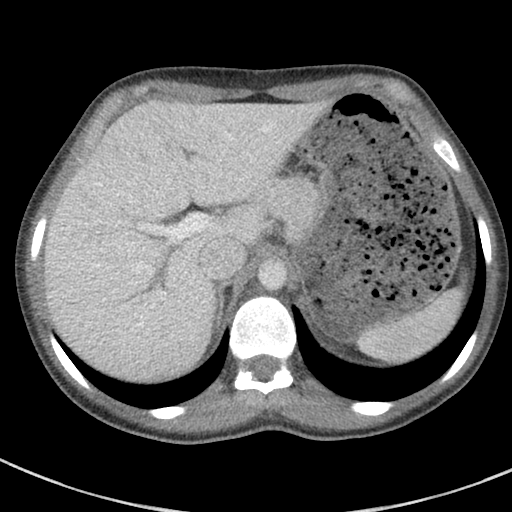
[im 84/92  soft-tissue]
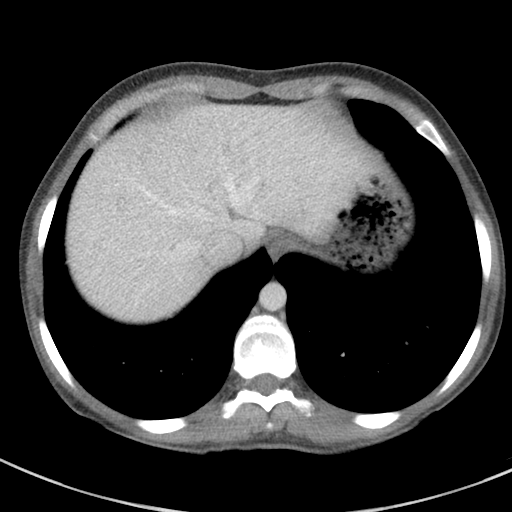

[Series 8: a/p w/ cor · coronal · 0.64mm/px · 3 of 112 slices shown]
[im 38/112  soft-tissue]
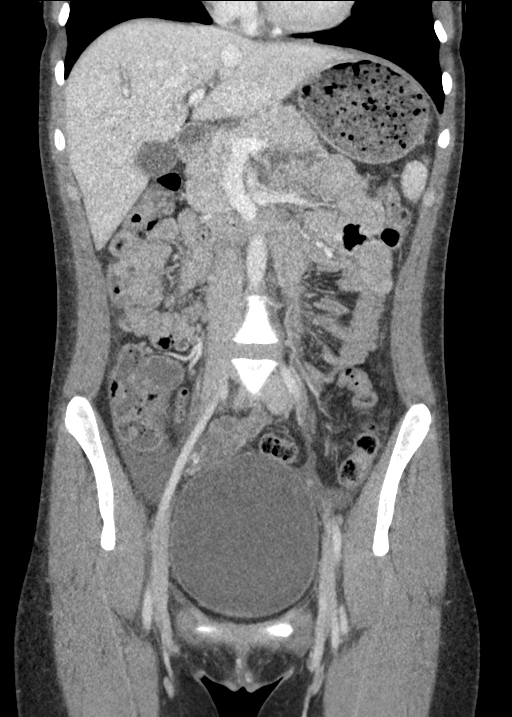
[im 50/112  soft-tissue]
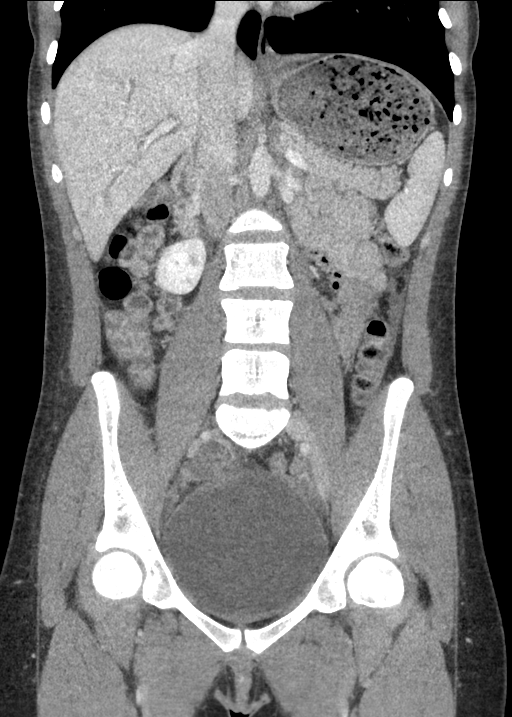
[im 62/112  soft-tissue]
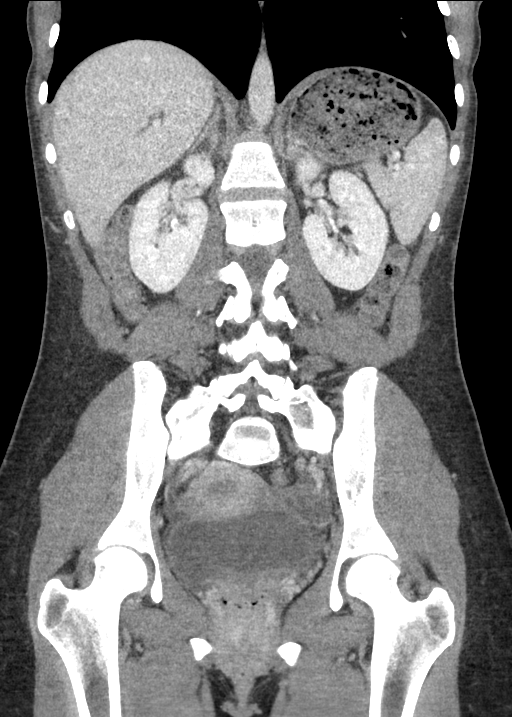

[14 of 46 positions shown; findings below may reference images not displayed]

FINDINGS: Lower chest: Lung bases are clear. Normal heart size. No pericardial
effusion.

Hepatobiliary: Normal hepatic enhancement with a smooth liver
surface contour. No visible focal liver lesions are identified.
There is diffuse mild periportal edema. No frank biliary ductal
dilatation. Gallbladder is unremarkable without visible calcified
gallstones or focal pericholecystic inflammation.

Pancreas: Unremarkable. No pancreatic ductal dilatation or
surrounding inflammatory changes.

Spleen: Normal in size. No concerning splenic lesions.

Adrenals/Urinary Tract: Normal adrenal glands. Kidneys enhance
symmetrically and uniformly. No concerning renal mass. No visible
urolithiasis or hydronephrosis. Moderate bladder distension, upper
limits of physiologic normal. There is some questionable bladder
wall thickening though this may be reactive.

Stomach/Bowel: Distal esophagus is unremarkable. Stomach distended
with ingested material. No small bowel thickening or dilatation.
There is a nonspecific fluid-filled appearance of the terminal
ileum. There is questionable thickening versus underdistention of
the proximal colon from the cecum to transverse colon. A normal
air-filled appendix courses superomedially from the cecum in the
right lower quadrant. No focal periappendiceal inflammation or
stranding is seen.

Vascular/Lymphatic: Prominence of the gonadal veins/parametrial
veins, nonspecific but can be seen in the setting of pelvic
congestion syndrome. No other significant vascular findings with
patency of the portal and hepatic veins and a nonaneurysmal
appearance of the aorta without acute luminal abnormality. No
suspicious or enlarged lymph nodes in the included lymphatic chains.

Reproductive: Anteverted uterus. Prominent parametrial vasculature,
as above. There is a peripherally enhancing, crenulated cystic
structure in the right adnexa as well as a moderate volume of low to
intermediate attenuation pelvic and bilateral adnexal free fluid,
right slightly greater than left.

Other: Small volume free fluid in the right pericolic gutter, pelvis
and bilateral adnexa, low to intermediate attenuation (20-25
Hounsfield units). Could suggest proteinaceous or hemorrhagic
contents. No free intraperitoneal air. No organized collection or
abscess is seen.

Musculoskeletal: No acute osseous abnormality or suspicious osseous
lesion. Transitional thoracolumbosacral anatomy with rudimentary
ribs versus unfused transverse processes of the vertebrae at the
thoracolumbar junction and a transitional lumbosacral vertebrae.
IMPRESSION: 1. Normal appendix without evidence of acute appendicitis.
2. Moderate volume of low to intermediate attenuation free fluid in
the right pericolic gutter, pelvis and bilateral adnexa, low to
intermediate attenuation (20-25 Hounsfield units). Given a
peripherally enhancing, crenulated cystic structure in the right
adnexa findings are suggestive of a ruptured cyst.
3. Mildly fluid-filled appearance of the terminal ileum with some
questionable edematous thickening of the proximal colon. Could
correlate for clinical features of enterocolitis to provide an
alternative explanation for patient's symptoms.
4. Prominence of the gonadal veins/parametrial veins, nonspecific
and possibly related to contrast timing but can also be seen in the
setting of pelvic congestion syndrome.
5. Mild periportal edema, nonspecific but can be seen in the setting
of aggressive rehydration as well as hepatitis.
6. Transitional thoracolumbosacral anatomy with rudimentary ribs
versus unfused transverse processes of the vertebrae at the
thoracolumbar junction and a transitional lumbosacral vertebrae.
